# Patient Record
Sex: Male | Born: 1990 | Race: Black or African American | Hispanic: No | Marital: Single | Smoking: Former smoker
Health system: Southern US, Community
[De-identification: ages and names within clinical notes are randomized; demographics above are authoritative.]

## PROBLEM LIST (undated history)

## (undated) DIAGNOSIS — J40 Bronchitis, not specified as acute or chronic: Secondary | ICD-10-CM

## (undated) HISTORY — PX: HIP SURGERY: SHX245

---

## 2014-10-23 DIAGNOSIS — J40 Bronchitis, not specified as acute or chronic: Secondary | ICD-10-CM

## 2014-10-23 HISTORY — DX: Bronchitis, not specified as acute or chronic: J40

## 2015-08-08 ENCOUNTER — Emergency Department (HOSPITAL_COMMUNITY): Payer: Self-pay

## 2015-08-08 ENCOUNTER — Emergency Department (HOSPITAL_COMMUNITY)
Admission: EM | Admit: 2015-08-08 | Discharge: 2015-08-08 | Disposition: A | Discharge status: Home / Self Care | Payer: Self-pay | Attending: Emergency Medicine | Admitting: Emergency Medicine

## 2015-08-08 ENCOUNTER — Encounter (HOSPITAL_COMMUNITY): Payer: Self-pay | Admitting: Emergency Medicine

## 2015-08-08 DIAGNOSIS — B353 Tinea pedis: Secondary | ICD-10-CM | POA: Insufficient documentation

## 2015-08-08 DIAGNOSIS — R0602 Shortness of breath: Secondary | ICD-10-CM | POA: Insufficient documentation

## 2015-08-08 DIAGNOSIS — R0789 Other chest pain: Secondary | ICD-10-CM | POA: Insufficient documentation

## 2015-08-08 LAB — BASIC METABOLIC PANEL
Anion gap: 13 (ref 5–15)
BUN: 10 mg/dL (ref 6–20)
CALCIUM: 10 mg/dL (ref 8.9–10.3)
CO2: 25 mmol/L (ref 22–32)
CREATININE: 1.15 mg/dL (ref 0.61–1.24)
Chloride: 101 mmol/L (ref 101–111)
Glucose, Bld: 114 mg/dL — ABNORMAL HIGH (ref 65–99)
Potassium: 3.2 mmol/L — ABNORMAL LOW (ref 3.5–5.1)
SODIUM: 139 mmol/L (ref 135–145)

## 2015-08-08 LAB — CBC
HCT: 42.7 % (ref 39.0–52.0)
Hemoglobin: 14.5 g/dL (ref 13.0–17.0)
MCH: 27.9 pg (ref 26.0–34.0)
MCHC: 34 g/dL (ref 30.0–36.0)
MCV: 82.3 fL (ref 78.0–100.0)
PLATELETS: 339 10*3/uL (ref 150–400)
RBC: 5.19 MIL/uL (ref 4.22–5.81)
RDW: 13.6 % (ref 11.5–15.5)
WBC: 7.9 10*3/uL (ref 4.0–10.5)

## 2015-08-08 LAB — I-STAT TROPONIN, ED: TROPONIN I, POC: 0 ng/mL (ref 0.00–0.08)

## 2015-08-08 MED ORDER — TOLNAFTATE 1 % EX POWD: 1.0000 "application " | Freq: Two times a day (BID) | CUTANEOUS | Status: DC

## 2015-08-08 NOTE — Discharge Instructions (Signed)

## 2015-08-08 NOTE — ED Provider Notes (Signed)
CSN: [id number]     Arrival date & time 08/08/15  1855 History   First MD Initiated Contact with Patient 08/08/15 2006     Chief Complaint  Patient presents with  . Chest Pain  . Foot Problem     (Consider location/radiation/quality/duration/timing/severity/associated sxs/prior Treatment) HPI Comments: Patient here after he developed some chest discomfort that he smoked marijuana. Became more short of breath and that is now resolved. Also complains of some blisters to his bilateral feet. States that his feet are persistently moist. No fever or chills. No purulent drainage. Denies any auditory or visual hallucinations. No other ingestions or drugs use or alcohol this time  Patient is a 24 y.o. male presenting with chest pain. The history is provided by the patient.  Chest Pain   History reviewed. No pertinent past medical history. Past Surgical History  Procedure Laterality Date  . Hip surgery     History reviewed. No pertinent family history. Social History  Substance Use Topics  . Smoking status: None  . Smokeless tobacco: None  . Alcohol Use: None    Review of Systems  Cardiovascular: Positive for chest pain.  All other systems reviewed and are negative.     Allergies  Review of patient's allergies indicates no known allergies.  Home Medications   Prior to Admission medications   Not on File   BP 150/86 mmHg  Pulse 98  Temp(Src) 98.5 F (36.9 C) (Oral)  Resp 20  SpO2 100% Physical Exam  Constitutional: He is oriented to person, place, and time. He appears well-developed and well-nourished.  Non-toxic appearance. No distress.  HENT:  Head: Normocephalic and atraumatic.  Eyes: Conjunctivae, EOM and lids are normal. Pupils are equal, round, and reactive to light.  Neck: Normal range of motion. Neck supple. No tracheal deviation present. No thyroid mass present.  Cardiovascular: Normal rate, regular rhythm and normal heart sounds.  Exam reveals no gallop.   No  murmur heard. Pulmonary/Chest: Effort normal and breath sounds normal. No stridor. No respiratory distress. He has no decreased breath sounds. He has no wheezes. He has no rhonchi. He has no rales.  Abdominal: Soft. Normal appearance and bowel sounds are normal. He exhibits no distension. There is no tenderness. There is no rebound and no CVA tenderness.  Musculoskeletal: Normal range of motion. He exhibits no edema or tenderness.  Changes on feet consistent with athlete's foot  Neurological: He is alert and oriented to person, place, and time. He has normal strength. No cranial nerve deficit or sensory deficit. GCS eye subscore is 4. GCS verbal subscore is 5. GCS motor subscore is 6.  Skin: Skin is warm and dry. No abrasion and no rash noted.  Psychiatric: He has a normal mood and affect. His speech is normal and behavior is normal.  Nursing note and vitals reviewed.   ED Course  Procedures (including critical care time) Labs Review Labs Reviewed  CBC  BASIC METABOLIC PANEL  I-STAT TROPOININ, ED    Imaging Review Dg Chest 2 View  08/08/2015  CLINICAL DATA:  Right side chest pain and shortness of breath today. Initial encounter. EXAM: CHEST  2 VIEW COMPARISON:  None. FINDINGS: The lungs are clear. Heart size is normal. There is no pneumothorax or pleural effusion. No focal bony abnormality. IMPRESSION: No acute disease. Electronically Signed   By: Drusilla Kannerhomas  Dalessio M.D.   On: 08/08/2015 19:45   I have personally reviewed and evaluated these images and lab results as part of my medical  decision-making.   EKG Interpretation   Date/Time:  Sunday August 08 2015 19:17:57 EDT Ventricular Rate:  96 PR Interval:  172 QRS Duration: 100 QT Interval:  324 QTC Calculation: 409 R Axis:   40 Text Interpretation:  Sinus rhythm Borderline ST elevation, anterior leads  Confirmed by Freida Busman  MD, Maysoon Lozada ([id number]) on 08/08/2015 8:06:55 PM      MDM   Final diagnoses:  None    Patient  instructed on proper care of his feet. This is read as negative. Stable for discharge    Lorre Nick, MD 08/08/15 2049

## 2015-08-08 NOTE — ED Notes (Signed)
Pt states he "smoked some pot earlier with a friend, and afterwards my chest just started hurting a lot." States the chest pain is making his feel slightly SOB. Also complaining of blister to lower right foot, and says he's expectorated some pus from the wound. No obvious redness/swelling to top of foot. Appears to be a blister to bottom of right foot.

## 2015-10-01 ENCOUNTER — Emergency Department (HOSPITAL_COMMUNITY): Payer: Self-pay

## 2015-10-01 ENCOUNTER — Emergency Department (HOSPITAL_COMMUNITY)
Admission: EM | Admit: 2015-10-01 | Discharge: 2015-10-01 | Disposition: A | Discharge status: Home / Self Care | Payer: Self-pay | Attending: Emergency Medicine | Admitting: Emergency Medicine

## 2015-10-01 DIAGNOSIS — R0789 Other chest pain: Secondary | ICD-10-CM | POA: Insufficient documentation

## 2015-10-01 DIAGNOSIS — Z79899 Other long term (current) drug therapy: Secondary | ICD-10-CM | POA: Insufficient documentation

## 2015-10-01 DIAGNOSIS — J40 Bronchitis, not specified as acute or chronic: Secondary | ICD-10-CM | POA: Insufficient documentation

## 2015-10-01 LAB — I-STAT TROPONIN, ED: Troponin i, poc: 0 ng/mL (ref 0.00–0.08)

## 2015-10-01 LAB — CBC
HEMATOCRIT: 44.9 % (ref 39.0–52.0)
HEMOGLOBIN: 15.1 g/dL (ref 13.0–17.0)
MCH: 27.7 pg (ref 26.0–34.0)
MCHC: 33.6 g/dL (ref 30.0–36.0)
MCV: 82.2 fL (ref 78.0–100.0)
Platelets: 267 10*3/uL (ref 150–400)
RBC: 5.46 MIL/uL (ref 4.22–5.81)
RDW: 13.5 % (ref 11.5–15.5)
WBC: 6 10*3/uL (ref 4.0–10.5)

## 2015-10-01 LAB — BASIC METABOLIC PANEL
Anion gap: 13 (ref 5–15)
BUN: 11 mg/dL (ref 6–20)
CHLORIDE: 102 mmol/L (ref 101–111)
CO2: 22 mmol/L (ref 22–32)
Calcium: 9.9 mg/dL (ref 8.9–10.3)
Creatinine, Ser: 1.08 mg/dL (ref 0.61–1.24)
GFR calc Af Amer: >60 mL/min (ref >60)
GFR calc non Af Amer: >60 mL/min (ref >60)
Glucose, Bld: 95 mg/dL (ref 65–99)
POTASSIUM: 3.6 mmol/L (ref 3.5–5.1)
SODIUM: 137 mmol/L (ref 135–145)

## 2015-10-01 MED ORDER — NAPROXEN 500 MG PO TABS: 500.0000 mg | ORAL_TABLET | Freq: Two times a day (BID) | ORAL | Status: DC

## 2015-10-01 MED ORDER — FLUTICASONE PROPIONATE 50 MCG/ACT NA SUSP: 1.0000 | Freq: Two times a day (BID) | NASAL | Status: DC

## 2015-10-01 NOTE — Discharge Instructions (Signed)
1. Medications: Flonase, naproxen - this medicine is best taken with food, continue usual home medications 2. Treatment: rest, drink plenty of fluids 3. Follow Up: Please follow up with your primary doctor in 3 days for discussion of your diagnoses and further evaluation after today's visit; if you do not have a primary care doctor use the resource guide provided to find one; Please return to the ER for fever, worsening chest pain, any new or worsening symptoms, any additional concerns.    Emergency Department Resource Guide 1) Find a Doctor and Pay Out of Pocket Although you won't have to find out who is covered by your insurance plan, it is a good idea to ask around and get recommendations. You will then need to call the office and see if the doctor you have chosen will accept you as a new patient and what types of options they offer for patients who are self-pay. Some doctors offer discounts or will set up payment plans for their patients who do not have insurance, but you will need to ask so you aren't surprised when you get to your appointment.  2) Contact Your Local Health Department Not all health departments have doctors that can see patients for sick visits, but many do, so it is worth a call to see if yours does. If you don't know where your local health department is, you can check in your phone book. The CDC also has a tool to help you locate your state's health department, and many state websites also have listings of all of their local health departments.  3) Find a Walk-in Clinic If your illness is not likely to be very severe or complicated, you may want to try a walk in clinic. These are popping up all over the country in pharmacies, drugstores, and shopping centers. They're usually staffed by nurse practitioners or physician assistants that have been trained to treat common illnesses and complaints. They're usually fairly quick and inexpensive. However, if you have serious medical  issues or chronic medical problems, these are probably not your best option.  No Primary Care Doctor: - Call Health Connect at  [phone] - they can help you locate a primary care doctor that  accepts your insurance, provides certain services, etc. - Physician Referral Service- [phone]  Chronic Pain Problems: Organization         Address  Phone   Notes  [facility]  [phone] Patients need to be referred by their primary care doctor.   Medication Assistance: Organization         Address  Phone   Notes  [facility] [address] [place], [place] [address] [phone]) [phone] --Must be a resident of [place] -- Must have NO insurance coverage whatsoever (no Medicaid/ Medicare, etc.) -- The pt. MUST have a primary care doctor that directs their care regularly and follows them in the community   [facility]ist  [phone]   [employer]  [phone]    Agencies that provide inexpensive medical care: Organization         Address  Phone   Notes  [facility]  [phone]   [facility]    [phone]   [facility] [address] [place], [place] [address] [phone]   [facility] [address], [place] [phone]) [phone]   [facility]    [phone](336) [phone]   [facility]    ([phone]) [phone]   °[facility] ° [address], [place] Phone:  ([phone]) [phone], Fax:  [phone] Hours of Operation:  9 am - 6 pm, M-F.  Also accepts Medicaid/Medicare and self-pay.  °[facility] ° [address], [place] Phone: [phone]) [phone], Fax: [phone]. Hours of Operation:  8:30 am - 5:30 pm, M-F.  Also accepts Medicaid and self-pay.  °[facility] [place] [address], [place] Phone: ([phone]) [phone]   °[facility] [address], [place], [place]  (336)[phone], Ext. 123 Mondays & Thursdays: 7-9 AM.  First 15 patients are seen on a first come, first serve basis. °  ° °Medicaid-accepting [place] Providers: ° °Organization         Address  Phone   Notes  °[facility] [address], [place] [phone]) [phone] Also accepts self-pay patients.  °[facility] [address], [place] ° ([phone]) [phone]   °[facility] [address], [place] ([phone]) [phone]   °[facility] [address] [place] [address], [place] ([phone]) [phone]   °Veita Bland [address], [place]  ° ([phone]) [phone] Only accepts [place] Access Medicaid patients after they have their name applied to their card.  ° °Self-Pay (no insurance) in [place]: ° °Organization         Address  Phone   Notes  °Sickle Cell Patients, [facility] [address], [place] ([phone]) [phone]   °[facility] [address], [place] (336) [phone]   °[facility] ° [address], [place] ([phone]) [phone]   °[facility]/Dr. Osei-Bonsu ° [address] [place] [address], [place] or [address], [place] [phone]) [phone] Phone number for both [place] and [place] locations is the same.  °[facility] [address], [place] ([phone]) [phone]   °[facility] [address] [place] [address], [place] or [address] (336) [phone] °(336) [phone]   °[facility] [address], [place] (336) [phone], phone; ([phone]) [phone], fax Sees patients 1st and 3rd Saturday of every month.  Must not qualify for public or private insurance (i.e. Medicaid, Medicare, [place] Health Choice, Veterans' Benefits) • Household income should be no more than 200% of the poverty level •The clinic cannot treat you if you are pregnant or think you are pregnant • Sexually transmitted  diseases are not treated at the clinic.  ° ° °Dental Care: °Organization         Address  Phone  Notes  °[place] [facility] [address], [place] (336) [phone] Accepts children up to age 21 who are enrolled in Medicaid or [place] Health Choice; pregnant women with a Medicaid card; and children who have applied for Medicaid or [place] Health Choice, but were declined, whose parents can pay a reduced fee at time of service.  °[place] [facility] [place]  [address], [place] (336) 641-7733 Accepts children up to age 21 who are enrolled in Medicaid or [place] Health Choice; pregnant women with a Medicaid card; and children who have applied for Medicaid or [place] Health Choice, but were declined, whose parents can pay a reduced fee at time of   service.  °[facility] ° [address], [place] ([phone]) [phone] Patients are seen by appointment only. Walk-ins are not accepted. [facility] will see patients 18 years of age and older. °Monday - Tuesday (8am-5pm) °Most Wednesdays (8:30-5pm) °$30 per visit, cash only  °[facility] ° [address], [place] ([phone]) [phone] Patients are seen by appointment only. Walk-ins are not accepted. [facility] will see patients 18 years of age and older. °One Wednesday Evening (Monthly: Volunteer Based).  $30 per visit, cash only  °[facility]  ([phone]) [phone] for adults; Children under age 4, call Graduate Pediatric Dentistry at ([phone]. Children aged 4-14, please call [phone]) [phone] to request a pediatric application. ° Dental services are provided in all areas of dental care including fillings, crowns and bridges, complete and partial dentures, implants, gum treatment, root canals, and extractions. Preventive care is also provided. Treatment is provided to both adults and children. °Patients are selected via a  lottery and there is often a waiting list. °  °[facility] [address], °[place] ° (336) 763-8833 [email or web address].[email or web address] °  °[facility] [address], [place], [place] (336)[phone], Ext. 123 Second and Fourth Thursday of each month, opens at 6:30 AM; Clinic ends at 9 AM.  Patients are seen on a first-come first-served basis, and a limited number are seen during each clinic.  ° °[facility] ° [address], [place], [place] ([phone]) [phone]   Eligibility Requirements °You must have lived in [place], [place], or [place] counties for at least the last three months. °  You cannot be eligible for state or federal sponsored healthcare insurance, including Veterans Administration, Medicaid, or Medicare. °  You generally cannot be eligible for healthcare insurance through your employer.  °  How to apply: °Eligibility screenings are held every Tuesday and Wednesday afternoon from 1:00 pm until 4:00 pm. You do not need an appointment for the interview!  °[facility] [address], [place], [place] [phone]   °[facility]  [phone]   °[facility]  336-703-3100   °[facility]  336-570-6415   ° °Behavioral Health Resources in the Community: °Intensive Outpatient Programs °Organization         Address  Phone  Notes  °[facility] [address], [place], [place] [phone]   °[facility] [address], [place], [place] 336-832-9800   °[facility] [address], [place], [place] ° 336-882-2125   °[facility] [address],  °[place], [place] 1-800-853-5163 or 336-641-4981   °Substance Abuse Resources °Organization         Address  Phone  Notes  °[facility]  336-882-2125   °[facility]  336-784-9470   °[facility]  336-285-9073   °[facility]  336-845-3988   °Residential &  Outpatient Substance Abuse Program  1-800-659-3381   °Psychological Services °Organization         Address  Phone  Notes  °[facility]  336- 832-9600   °[facility]  336- 378-7881   °[facility] [address], [place] 1-800-853-5163 or 336-641-4981   ° °Mobile Crisis Teams °Organization         Address  Phone  Notes  °[facility], Mobile Crisis Care Unit  1-877-626-1772   °Assertive °Psychotherapeutic Services °   [place], [place]   [facility] [address] [place] [phone])[phone]    Self-Help/Support Groups Organization         Address  Phone             Notes  [place] [facility] [place] - variety of support groups  [place] Call for more information  Narcotics Anonymous (NA), [facility] [address], [employer] [place]  2 meetings at this location   Special educational needs teacher         Address  Phone  Notes  [facility] [place],    [place]  1-734-814-4231   [facility]  [address], [place] [address], [place], [place]   [place], [place] [phone] Admissions: 8am-3pm M-F  Incentives Substance [place] [address],    [place], [place] [phone]   [facility] [address] [place], [place], [place]   [facility] [address],  [place], [place]   [facility] - Intensive Outpatient [address], [place], [place]   [facility] ([place]  [place], [place] 1-(347)298-3894 or 563-851-8313   [facility] [address] [place], [place] Accepts Medicaid  [facility] [address]  [place] 1-(716)779-6540 Substance Abuse/Addiction Treatment   [facility] Organization          Address  Phone  Notes  [facility]  412-686-1849   Domenic Schwab, PhD [address] [place], [place]   (867) 513-2775 or [phone]   [facility] [place], [place] (224)[phone]   [facility] [address] [place], [place] [phone] Insurance/Medicaid/sponsorship through [facility] and Families [address]                                    [place], [place] [phone] [place] [address], [place] [phone]    Dr. Adele Schilder  [phone]   [facility] Dept. 1) [address], [place] 2) [place] 3)  [place] [address], [place] [phone]  [phone]   [place] [phone] or [phone] (After Hours)

## 2015-10-01 NOTE — ED Provider Notes (Addendum)
CSN: [id number]     Arrival date & time 10/01/15  1317 History   First MD Initiated Contact with Patient 10/01/15 1637     Chief Complaint  Patient presents with  . Chest Pain     (Consider location/radiation/quality/duration/timing/severity/associated sxs/prior Treatment) HPI  Kevin Boone is a 24 y.o. male  with no significant PMH who presents to the Emergency Department complaining of sharp chest pain episodes lasting 1-3 minutes for the past 3-4 days. Worse with movement and deep breathing. Patient states last night he was coughing up phlegm, then noticed blood in the mucus - less than a tsp. Had one more incident of hemoptysis this morning, once again less than a tsp. Admits to subjective fever at home, PND, and left shoulder pain.    No past medical history on file. Past Surgical History  Procedure Laterality Date  . Hip surgery     No family history on file. Social History  Substance Use Topics  . Smoking status: Not on file  . Smokeless tobacco: Not on file  . Alcohol Use: Not on file    Review of Systems  Constitutional: Positive for fever and chills. Negative for diaphoresis, activity change, appetite change, fatigue and unexpected weight change.  HENT: Positive for congestion and postnasal drip. Negative for sore throat.   Eyes: Negative for visual disturbance.  Respiratory: Positive for cough and chest tightness. Negative for shortness of breath and wheezing.   Cardiovascular: Positive for chest pain. Negative for palpitations and leg swelling.  Gastrointestinal: Negative for nausea, vomiting, abdominal pain, diarrhea and constipation.  Endocrine: Negative for polydipsia and polyuria.  Musculoskeletal: Negative for myalgias, back pain, arthralgias and neck pain.  Skin: Negative for rash.  Neurological: Negative for dizziness, weakness and headaches.      Allergies  Review of patient's allergies indicates no known allergies.  Home Medications   Prior to  Admission medications   Medication Sig Start Date End Date Taking? Authorizing Provider  fluticasone (FLONASE) 50 MCG/ACT nasal spray Place 1 spray into both nostrils 2 (two) times daily. 10/01/15   Chase PicketJaime Pilcher Rhyse Loux, PA-C  naproxen (NAPROSYN) 500 MG tablet Take 1 tablet (500 mg total) by mouth 2 (two) times daily. 10/01/15   Eartha Vonbehren Pilcher Shenice Dolder, PA-C  tolnaftate (TINACTIN) 1 % powder Apply 1 application topically 2 (two) times daily. 08/08/15   Lorre NickAnthony Allen, MD   BP 131/86 mmHg  Pulse 62  Temp(Src) 98.5 F (36.9 C) (Oral)  Resp 20  Ht 6\' 2"  (1.88 m)  Wt 97.523 kg  BMI 27.59 kg/m2  SpO2 100% Physical Exam  Constitutional: He is oriented to person, place, and time. He appears well-developed and well-nourished.  Alert and in no acute distress  HENT:  Head: Normocephalic and atraumatic.  Cardiovascular: Normal rate, regular rhythm and normal heart sounds.  Exam reveals no gallop and no friction rub.   No murmur heard. Pulmonary/Chest: Effort normal and breath sounds normal. No respiratory distress. He has no wheezes. He has no rales.    Abdominal: Soft. Bowel sounds are normal. He exhibits no distension and no mass. There is no tenderness. There is no rebound and no guarding.  Musculoskeletal: He exhibits no edema.  Neurological: He is alert and oriented to person, place, and time.  Skin: Skin is warm and dry. No rash noted.  Nursing note and vitals reviewed.   ED Course  Procedures (including critical care time) Labs Review Labs Reviewed  BASIC METABOLIC PANEL  CBC  I-STAT TROPOININ, ED  Imaging Review Dg Chest 2 View  10/01/2015  CLINICAL DATA:  Chest pain for 2 week EXAM: CHEST  2 VIEW COMPARISON:  08/08/2015 FINDINGS: Normal mediastinum and cardiac silhouette. Normal pulmonary vasculature. No evidence of effusion, infiltrate, or pneumothorax. No acute bony abnormality. IMPRESSION: No acute cardiopulmonary process.  Normal chest radiograph. Electronically Signed   By:  Genevive Bi M.D.   On: 10/01/2015 14:04   I have personally reviewed and evaluated these images and lab results as part of my medical decision-making.   EKG Interpretation   Date/Time:  Friday October 01 2015 13:20:02 EST Ventricular Rate:  99 PR Interval:  164 QRS Duration: 102 QT Interval:  340 QTC Calculation: 436 R Axis:   49 Text Interpretation:  Normal sinus rhythm Right atrial enlargement  Borderline ECG Left ventricular hypertrophy Otherwise no significant  change Confirmed by FLOYD MD, Reuel Boom ([id number]) on 10/01/2015 4:58:26 PM      MDM   Final diagnoses:  Atypical chest pain  Bronchitis   Kevin Boone presents with chest and right shoulder pain that is reproducible with palpation and movement.  PERC negative, 0 Wells Score, HEART score 0  Labs: trop 0, CBC and BMP wdl Imaging: CXR with no acute cardiopulm dz   A&P: Atypical chest pain likely 2/2 bronchitis vs. Musculoskeletal   - Flonase, naproxen, symptomatic care instructions given  - PCP follow up encouraged, Return precautions given  Blood pressure 131/86, pulse 62, temperature 98.5 F (36.9 C), temperature source Oral, resp. rate 20, height  (1.88 m), weight 97.523 kg, SpO2 100 %.  Chase Picket [place] R[place]on, PA-C 10/01/15 1835  Melene Plan, DO 10/01/15 1843  Chase Picket Mendell Bontempo, PA-C 11/10/15 1544  Melene Plan, DO 11/10/15 1845

## 2015-10-01 NOTE — ED Notes (Signed)
Pt states last night he was coughing up blood, pt c/o pressure in his chest. Started 2-3 days ago.

## 2015-10-04 ENCOUNTER — Encounter (HOSPITAL_COMMUNITY): Payer: Self-pay

## 2015-10-04 ENCOUNTER — Emergency Department (HOSPITAL_COMMUNITY)
Admission: EM | Admit: 2015-10-04 | Discharge: 2015-10-04 | Disposition: A | Discharge status: Home / Self Care | Payer: Self-pay | Attending: Emergency Medicine | Admitting: Emergency Medicine

## 2015-10-04 DIAGNOSIS — R0789 Other chest pain: Secondary | ICD-10-CM | POA: Insufficient documentation

## 2015-10-04 DIAGNOSIS — Z791 Long term (current) use of non-steroidal anti-inflammatories (NSAID): Secondary | ICD-10-CM | POA: Insufficient documentation

## 2015-10-04 DIAGNOSIS — F17200 Nicotine dependence, unspecified, uncomplicated: Secondary | ICD-10-CM | POA: Insufficient documentation

## 2015-10-04 DIAGNOSIS — F419 Anxiety disorder, unspecified: Secondary | ICD-10-CM | POA: Insufficient documentation

## 2015-10-04 DIAGNOSIS — J069 Acute upper respiratory infection, unspecified: Secondary | ICD-10-CM | POA: Insufficient documentation

## 2015-10-04 NOTE — ED Provider Notes (Signed)
CSN: [id number]     Arrival date & time 10/04/15  [id number] History   First MD Initiated Contact with Patient 10/04/15 [id number]     Chief Complaint  Patient presents with  . Hemoptysis     (Consider location/radiation/quality/duration/timing/severity/associated sxs/prior Treatment) HPI Comments: Patient with history only of being a smoker presents for re-evaluation of cough, productive of blood-tinged sputum, and chest wall pain. No fever. He was initially seen 10/01/15 in the emergency department and was told he had bronchitis. He was given a steroid nasal spray and Naproxen and reports he was told to return if the chest pain did not get better. He was not able to fill the prescription for the steroid nasal spray.   The history is provided by the patient. No language interpreter was used.    History reviewed. No pertinent past medical history. Past Surgical History  Procedure Laterality Date  . Hip surgery     No family history on file. Social History  Substance Use Topics  . Smoking status: Current Every Day Smoker  . Smokeless tobacco: None  . Alcohol Use: No    Review of Systems  Constitutional: Positive for chills. Negative for fever.  HENT: Positive for congestion.   Respiratory: Negative.   Cardiovascular: Negative.   Gastrointestinal: Negative.  Negative for nausea, vomiting and abdominal pain.  Musculoskeletal: Negative.  Negative for myalgias and neck stiffness.  Skin: Negative.  Negative for rash.  Neurological: Negative.       Allergies  Review of patient's allergies indicates no known allergies.  Home Medications   Prior to Admission medications   Medication Sig Start Date End Date Taking? Authorizing Provider  naproxen (NAPROSYN) 500 MG tablet Take 1 tablet (500 mg total) by mouth 2 (two) times daily. 10/01/15  Yes Jaime Pilcher Ward, PA-C  fluticasone (FLONASE) 50 MCG/ACT nasal spray Place 1 spray into both nostrils 2 (two) times daily. Patient not taking:  Reported on 10/04/2015 10/01/15   [facility]   BP 121/73 mmHg  Pulse 70  Temp(Src) 97.9 F (36.6 C) (Oral)  Resp 20  SpO2 100% Physical Exam  Constitutional: He is oriented to person, place, and time. He appears well-developed and well-nourished. No distress.  Eyes: Conjunctivae are normal.  Neck: Normal range of motion. Neck supple.  Cardiovascular: Normal rate and regular rhythm.   No murmur heard. Pulmonary/Chest: Effort normal. He has no wheezes. He has no rales. He exhibits tenderness.  Mild left chest wall tenderness.   Abdominal: Soft. There is no tenderness.  Musculoskeletal: Normal range of motion.  Neurological: He is alert and oriented to person, place, and time.  Skin: Skin is warm and dry.  Psychiatric: His mood appears anxious.    ED Course  Procedures (including critical care time) Labs Review Labs Reviewed - No data to display  Imaging Review No results found. I have personally reviewed and evaluated these images and lab results as part of my medical decision-making.   EKG Interpretation None      MDM   Final diagnoses:  None    1. URI  The patient is non-toxic in appearance, awake, alert, no respiratory difficulty and no active coughing during interview or exam. He has chest wall tenderness without concern for rib injury. VSS, no hypoxia, tachycardia or tachypnea. Review of chest x-ray from 10/01/15 is negative. Suspect viral process. Patient appears to have component of anxiety adding to his worry regarding symptoms. He is stable and appropriate for discharge with repeat imaging.  Elpidio Anis, PA-C 10/04/15 1610  Dione Booze, MD 10/04/15 [phone]

## 2015-10-04 NOTE — ED Notes (Signed)
Was seen here on 12/9 for chest pain and given antibiotics but told they didn't know what was wrong with him. Tonight coughed up blood for the first time and is concerned.

## 2015-10-04 NOTE — ED Notes (Signed)
Pt verbalized understanding of d/c instructions and follow-up care. No further questions/concerns, VSS, ambulatory w/ steady gait (refused wheelchair) 

## 2015-10-04 NOTE — Discharge Instructions (Signed)
Chest Wall Pain °Chest wall pain is pain in or around the bones and muscles of your chest. Sometimes, an injury causes this pain. Sometimes, the cause may not be known. This pain may take several weeks or longer to get better. °HOME CARE INSTRUCTIONS  °Pay attention to any changes in your symptoms. Take these actions to help with your pain:  °· Rest as told by your health care provider.   °· Avoid activities that cause pain. These include any activities that use your chest muscles or your abdominal and side muscles to lift heavy items.    °· If directed, apply ice to the painful area: °· Put ice in a plastic bag. °· Place a towel between your skin and the bag. °· Leave the ice on for 20 minutes, 2-3 times per day. °· Take over-the-counter and prescription medicines only as told by your health care provider. °· Do not use tobacco products, including cigarettes, chewing tobacco, and e-cigarettes. If you need help quitting, ask your health care provider. °· Keep all follow-up visits as told by your health care provider. This is important. °SEEK MEDICAL CARE IF: °· You have a fever. °· Your chest pain becomes worse. °· You have new symptoms. °SEEK IMMEDIATE MEDICAL CARE IF: °· You have nausea or vomiting. °· You feel sweaty or light-headed. °· You have a cough with phlegm (sputum) or you cough up blood. °· You develop shortness of breath. °  °This information is not intended to replace advice given to you by your health care provider. Make sure you discuss any questions you have with your health care provider. °  °Document Released: 10/09/2005 Document Revised: 06/30/2015 Document Reviewed: 01/04/2015 °Elsevier Interactive Patient Education ©2016 Else[employer] Inc. ° °Upper Respiratory Infection, Adult °Most upper respiratory infections (URIs) are a viral infection of the air passages leading to the lungs. A URI affects the nose, throat, and upper air passages. The most common type of URI is nasopharyngitis and is typically  referred to as "the common cold." °URIs run their course and usually go away on their own. Most of the time, a URI does not require medical attention, but sometimes a bacterial infection in the upper airways can follow a viral infection. This is called a secondary infection. Sinus and middle ear infections are common types of secondary upper respiratory infections. °Bacterial pneumonia can also complicate a URI. A URI can worsen asthma and chronic obstructive pulmonary disease (COPD). Sometimes, these complications can require emergency medical care and may be life threatening.  °CAUSES °Almost all URIs are caused by viruses. A virus is a type of germ and can spread from one person to another.  °RISKS FACTORS °You may be at risk for a URI if:  °· You smoke.   °· You have chronic heart or lung disease. °· You have a weakened defense (immune) system.   °· You are very young or very old.   °· You have nasal allergies or asthma. °· You work in crowded or poorly ventilated areas. °· You work in health care facilities or schools. °SIGNS AND SYMPTOMS  °Symptoms typically develop 2-3 days after you come in contact with a cold virus. Most viral URIs last 7-10 days. However, viral URIs from the influenza virus (flu virus) can last 14-18 days and are typically more severe. Symptoms may include:  °· Runny or stuffy (congested) nose.   °· Sneezing.   °· Cough.   °· Sore throat.   °· Headache.   °· Fatigue.   °· Fever.   °· Loss of appetite.   °· Pain   in your forehead, behind your eyes, and over your cheekbones (sinus pain). °· Muscle aches.   °DIAGNOSIS  °Your health care provider may diagnose a URI by: °· Physical exam. °· Tests to check that your symptoms are not due to another condition such as: °¨ Strep throat. °¨ Sinusitis. °¨ Pneumonia. °¨ Asthma. °TREATMENT  °A URI goes away on its own with time. It cannot be cured with medicines, but medicines may be prescribed or recommended to relieve symptoms. Medicines may  help: °· Reduce your fever. °· Reduce your cough. °· Relieve nasal congestion. °HOME CARE INSTRUCTIONS  °· Take medicines only as directed by your health care provider.   °· Gargle warm saltwater or take cough drops to comfort your throat as directed by your health care provider. °· Use a warm mist humidifier or inhale steam from a shower to increase air moisture. This may make it easier to breathe. °· Drink enough fluid to keep your urine clear or pale yellow.   °· Eat soups and other clear broths and maintain good nutrition.   °· Rest as needed.   °· Return to work when your temperature has returned to normal or as your health care provider advises. You may need to stay home longer to avoid infecting others. You can also use a face mask and careful hand washing to prevent spread of the virus. °· Increase the usage of your inhaler if you have asthma.   °· Do not use any tobacco products, including cigarettes, chewing tobacco, or electronic cigarettes. If you need help quitting, ask your health care provider. °PREVENTION  °The best way to protect yourself from getting a cold is to practice good hygiene.  °· Avoid oral or hand contact with people with cold symptoms.   °· Wash your hands often if contact occurs.   °There is no clear evidence that vitamin C, vitamin E, echinacea, or exercise reduces the chance of developing a cold. However, it is always recommended to get plenty of rest, exercise, and practice good nutrition.  °SEEK MEDICAL CARE IF:  °· You are getting worse rather than better.   °· Your symptoms are not controlled by medicine.   °· You have chills. °· You have worsening shortness of breath. °· You have brown or red mucus. °· You have yellow or brown nasal discharge. °· You have pain in your face, especially when you bend forward. °· You have a fever. °· You have swollen neck glands. °· You have pain while swallowing. °· You have white areas in the back of your throat. °SEEK IMMEDIATE MEDICAL CARE IF:   °· You have severe or persistent: °¨ Headache. °¨ Ear pain. °¨ Sinus pain. °¨ Chest pain. °· You have chronic lung disease and any of the following: °¨ Wheezing. °¨ Prolonged cough. °¨ Coughing up blood. °¨ A change in your usual mucus. °· You have a stiff neck. °· You have changes in your: °¨ Vision. °¨ Hearing. °¨ Thinking. °¨ Mood. °MAKE SURE YOU:  °· Understand these instructions. °· Will watch your condition. °· Will get help right away if you are not doing well or get worse. °  °This information is not intended to replace advice given to you by your health care provider. Make sure you discuss any questions you have with your health care provider. °  °Document Released: 04/04/2001 Document Revised: 02/23/2015 Document Reviewed: 01/14/2014 °Elsevier Interactive Patient Education ©2016 Elsevier Inc. ° °

## 2015-10-04 NOTE — ED Notes (Signed)
Pt ambulatory w/ steady gait, SpO2 100% and hr 80bpm.

## 2015-10-20 ENCOUNTER — Encounter (HOSPITAL_COMMUNITY): Payer: Self-pay | Admitting: Emergency Medicine

## 2015-10-20 ENCOUNTER — Emergency Department (HOSPITAL_COMMUNITY): Payer: Self-pay

## 2015-10-20 DIAGNOSIS — R042 Hemoptysis: Secondary | ICD-10-CM | POA: Insufficient documentation

## 2015-10-20 DIAGNOSIS — Z791 Long term (current) use of non-steroidal anti-inflammatories (NSAID): Secondary | ICD-10-CM | POA: Insufficient documentation

## 2015-10-20 DIAGNOSIS — R109 Unspecified abdominal pain: Secondary | ICD-10-CM | POA: Insufficient documentation

## 2015-10-20 DIAGNOSIS — R079 Chest pain, unspecified: Secondary | ICD-10-CM | POA: Insufficient documentation

## 2015-10-20 DIAGNOSIS — F17200 Nicotine dependence, unspecified, uncomplicated: Secondary | ICD-10-CM | POA: Insufficient documentation

## 2015-10-20 DIAGNOSIS — Z7951 Long term (current) use of inhaled steroids: Secondary | ICD-10-CM | POA: Insufficient documentation

## 2015-10-20 LAB — COMPREHENSIVE METABOLIC PANEL
ALK PHOS: 49 U/L (ref 38–126)
ALT: 36 U/L (ref 17–63)
ANION GAP: 12 (ref 5–15)
AST: 32 U/L (ref 15–41)
Albumin: 4.3 g/dL (ref 3.5–5.0)
BUN: 13 mg/dL (ref 6–20)
CALCIUM: 9.7 mg/dL (ref 8.9–10.3)
CO2: 26 mmol/L (ref 22–32)
CREATININE: 1.02 mg/dL (ref 0.61–1.24)
Chloride: 102 mmol/L (ref 101–111)
Glucose, Bld: 105 mg/dL — ABNORMAL HIGH (ref 65–99)
Potassium: 3.9 mmol/L (ref 3.5–5.1)
SODIUM: 140 mmol/L (ref 135–145)
TOTAL PROTEIN: 6.7 g/dL (ref 6.5–8.1)
Total Bilirubin: 0.5 mg/dL (ref 0.3–1.2)

## 2015-10-20 LAB — CBC
HCT: 38.3 % — ABNORMAL LOW (ref 39.0–52.0)
HEMOGLOBIN: 12.7 g/dL — AB (ref 13.0–17.0)
MCH: 27.3 pg (ref 26.0–34.0)
MCHC: 33.2 g/dL (ref 30.0–36.0)
MCV: 82.4 fL (ref 78.0–100.0)
PLATELETS: 331 10*3/uL (ref 150–400)
RBC: 4.65 MIL/uL (ref 4.22–5.81)
RDW: 13.8 % (ref 11.5–15.5)
WBC: 5.8 10*3/uL (ref 4.0–10.5)

## 2015-10-20 LAB — URINALYSIS, ROUTINE W REFLEX MICROSCOPIC
Bilirubin Urine: NEGATIVE
Glucose, UA: NEGATIVE mg/dL
HGB URINE DIPSTICK: NEGATIVE
Ketones, ur: NEGATIVE mg/dL
LEUKOCYTES UA: NEGATIVE
NITRITE: NEGATIVE
PROTEIN: NEGATIVE mg/dL
SPECIFIC GRAVITY, URINE: 1.022 (ref 1.005–1.030)
pH: 6.5 (ref 5.0–8.0)

## 2015-10-20 LAB — LIPASE, BLOOD: LIPASE: 40 U/L (ref 11–51)

## 2015-10-20 NOTE — ED Notes (Signed)
Pt reports left flank pain x2 days with dysuria, pt also reports cough and congestion but was told he had bronchitis, states was prescribed naproxen but smptoms are still there. nad noted. Airway intact.

## 2015-10-21 ENCOUNTER — Encounter (HOSPITAL_COMMUNITY): Payer: Self-pay | Admitting: Radiology

## 2015-10-21 ENCOUNTER — Emergency Department (HOSPITAL_COMMUNITY)
Admission: EM | Admit: 2015-10-21 | Discharge: 2015-10-21 | Disposition: A | Discharge status: Home / Self Care | Payer: Self-pay | Attending: Emergency Medicine | Admitting: Emergency Medicine

## 2015-10-21 ENCOUNTER — Emergency Department (HOSPITAL_COMMUNITY): Payer: Self-pay

## 2015-10-21 DIAGNOSIS — R109 Unspecified abdominal pain: Secondary | ICD-10-CM

## 2015-10-21 DIAGNOSIS — R042 Hemoptysis: Secondary | ICD-10-CM

## 2015-10-21 DIAGNOSIS — R079 Chest pain, unspecified: Secondary | ICD-10-CM

## 2015-10-21 MED ORDER — AMOXICILLIN 500 MG PO CAPS: 500.0000 mg | ORAL_CAPSULE | Freq: Three times a day (TID) | ORAL | Status: DC

## 2015-10-21 MED ORDER — IOHEXOL 350 MG/ML SOLN
100.0000 mL | Freq: Once | INTRAVENOUS | Status: AC | PRN
  Administered 2015-10-21: 100 mL via INTRAVENOUS

## 2015-10-21 NOTE — Discharge Instructions (Signed)
Amoxicillin as prescribed.  Ibuprofen 600 mg every 6 hours as needed for pain.  You should obtain a primary physician with whom you can follow-up if you're not improving in the next week.   Hemoptysis Hemoptysis, which means coughing up blood, can be a sign of a minor problem or a serious medical condition. The blood that is coughed up may come from the lungs and airways. Coughed-up blood can also come from bleeding that occurs outside the lungs and airways. Blood can drain into the windpipe during a severe nosebleed or when blood is vomited from the stomach. Because hemoptysis can be a sign of something serious, a medical evaluation is required. For some people with hemoptysis, no definite cause is ever identified. CAUSES  The most common cause of hemoptysis is bronchitis. Some other common causes include:   A ruptured blood vessel caused by coughing or an infection.   A medical condition that causes damage to the large air passageways (bronchiectasis).   A blood clot in the lungs (pulmonary embolism).   Pneumonia.   Tuberculosis.   Breathing in a small foreign object.   Cancer. For some people with hemoptysis, no definite cause is ever identified.  HOME CARE INSTRUCTIONS  Only take over-the-counter or prescription medicines as directed by your caregiver. Do not use cough suppressants unless your caregiver approves.  If your caregiver prescribes antibiotic medicines, take them as directed. Finish them even if you start to feel better.  Do not smoke. Also avoid secondhand smoke.  Follow up with your caregiver as directed. SEEK IMMEDIATE MEDICAL CARE IF:   You cough up bloody mucus for longer than a week.  You have a blood-producing cough that is severe or getting worse.  You have a blood-producing cough thatcomes and goes over time.  You develop problems with your breathing.   You vomit blood.  You develop bloody or black-colored stools.  You have chest pain.    You develop night sweats.  You feel faint or pass out.   You have a fever or persistent symptoms for more than 2-3 days.  You have a fever and your symptoms suddenly get worse. MAKE SURE YOU:  Understand these instructions.  Will watch your condition.  Will get help right away if you are not doing well or get worse.   This information is not intended to replace advice given to you by your health care provider. Make sure you discuss any questions you have with your health care provider.   Document Released: 12/18/2001 Document Revised: 09/25/2012 Document Reviewed: 07/26/2012 Elsevier Interactive Patient Education 2016 Elsevier Inc.    Chest Wall Pain Chest wall pain is pain in or around the bones and muscles of your chest. Sometimes, an injury causes this pain. Sometimes, the cause may not be known. This pain may take several weeks or longer to get better. HOME CARE INSTRUCTIONS  Pay attention to any changes in your symptoms. Take these actions to help with your pain:   Rest as told by your health care provider.   Avoid activities that cause pain. These include any activities that use your chest muscles or your abdominal and side muscles to lift heavy items.   If directed, apply ice to the painful area:  Put ice in a plastic bag.  Place a towel between your skin and the bag.  Leave the ice on for 20 minutes, 2-3 times per day.  Take over-the-counter and prescription medicines only as told by your health care provider.  Do not use tobacco products, including cigarettes, chewing tobacco, and e-cigarettes. If you need help quitting, ask your health care provider.  Keep all follow-up visits as told by your health care provider. This is important. SEEK MEDICAL CARE IF:  You have a fever.  Your chest pain becomes worse.  You have new symptoms. SEEK IMMEDIATE MEDICAL CARE IF:  You have nausea or vomiting.  You feel sweaty or light-headed.  You have a  cough with phlegm (sputum) or you cough up blood.  You develop shortness of breath.   This information is not intended to replace advice given to you by your health care provider. Make sure you discuss any questions you have with your health care provider.   Document Released: 10/09/2005 Document Revised: 06/30/2015 Document Reviewed: 01/04/2015 Elsevier Interactive Patient Education [employer] Else[employer] Inc.

## 2015-10-21 NOTE — ED Notes (Signed)
Dr. delo at the bedside. 

## 2015-10-21 NOTE — ED Provider Notes (Signed)
CSN: [id number]     Arrival date & time 10/20/15  2023 History   First MD Initiated Contact with Patient 10/21/15 0102     Chief Complaint  Patient presents with  . Flank Pain  . Leg Pain     (Consider location/radiation/quality/duration/timing/severity/associated sxs/prior Treatment) HPI Comments: Patient is a 24 year old otherwise healthy male who presents with multiple complaints. This is his third visit in the last month with complaints of pain in his chest and cough. He reports to me that his cough is now productive and reports that there is blood in his sputum. He is also having pain in his left flank. He also reports that his tongue was green several days ago, then changed to yellow when it is normally white. He denies any fevers or chills. He denies any vomiting or diarrhea. He reports a history of smoking marijuana, however has stopped doing this since he has had this illness.  The history is provided by the patient.    History reviewed. No pertinent past medical history. Past Surgical History  Procedure Laterality Date  . Hip surgery     No family history on file. Social History  Substance Use Topics  . Smoking status: Current Every Day Smoker  . Smokeless tobacco: None  . Alcohol Use: No    Review of Systems  All other systems reviewed and are negative.     Allergies  Review of patient's allergies indicates no known allergies.  Home Medications   Prior to Admission medications   Medication Sig Start Date End Date Taking? Authorizing Provider  fluticasone (FLONASE) 50 MCG/ACT nasal spray Place 1 spray into both nostrils 2 (two) times daily. Patient not taking: Reported on 10/04/2015 10/01/15   [facility] Ward, PA-C  naproxen (NAPROSYN) 500 MG tablet Take 1 tablet (500 mg total) by mouth 2 (two) times daily. 10/01/15   Jaime Pilcher Ward, PA-C   BP 150/80 mmHg  Pulse 87  Temp(Src) 98 F (36.7 C) (Oral)  Resp 16  Ht  (1.88 m)  Wt 220 lb (99.791 kg)   BMI 28.23 kg/m2  SpO2 100% Physical Exam  Constitutional: He is oriented to person, place, and time. He appears well-developed and well-nourished. No distress.  HENT:  Head: Normocephalic and atraumatic.  Mouth/Throat: Oropharynx is clear and moist.  Neck: Normal range of motion. Neck supple.  Cardiovascular: Normal rate, regular rhythm and normal heart sounds.   Pulmonary/Chest: Effort normal and breath sounds normal. No respiratory distress. He has no wheezes.  Abdominal: Soft. Bowel sounds are normal. He exhibits no distension. There is tenderness. There is no rebound and no guarding.  There is tenderness to palpation in the left flank and left lateral abdomen.  Musculoskeletal: Normal range of motion. He exhibits no edema.  There is no calf swelling or tenderness. Homans sign is absent bilaterally.  Lymphadenopathy:    He has no cervical adenopathy.  Neurological: He is alert and oriented to person, place, and time.  Skin: Skin is warm and dry. He is not diaphoretic.  Nursing note and vitals reviewed.   ED Course  Procedures (including critical care time) Labs Review Labs Reviewed  COMPREHENSIVE METABOLIC PANEL - Abnormal; Notable for the following:    Glucose, Bld 105 (*)    All other components within normal limits  CBC - Abnormal; Notable for the following:    Hemoglobin 12.7 (*)    HCT 38.3 (*)    All other components within normal limits  LIPASE, BLOOD  URINALYSIS, ROUTINE W REFLEX MICROSCOPIC [facility])    Imaging Review Dg Chest 2 View  10/20/2015  CLINICAL DATA:  Right-sided chest pain for 1 month EXAM: CHEST - 2 VIEW COMPARISON:  10/01/2015 FINDINGS: The heart size and mediastinal contours are within normal limits. Both lungs are clear. The visualized skeletal structures are unremarkable. IMPRESSION: No active disease. Electronically Signed   By: Alcide CleverMark  Lukens M.D.   On: 10/20/2015 21:46   I have personally reviewed and evaluated these images and lab results as  part of my medical decision-making.   EKG Interpretation None      MDM   Final diagnoses:  Left flank pain    Patient presents here with a constellation of symptoms that are confusing and very difficult to attribute to one disease process. This is now his third trip to the ER this month and he appears quite frustrated that no one has solved his problem. He reports hemoptysis. I have an exceedingly low suspicion of pulmonary embolism, however a CT scan was obtained which was negative. Is also complaining of pain in his left flank. A CT scan was performed of his abdomen to rule out obstructive uropathy. This was performed and was negative as well. There was the mention of some abnormality on the right kidney, and the clinical correlation with urinalysis rules out a UTI.  At this point, I am not certain as to what is causing this patient's symptoms, however I highly doubt there is anything emergent. I will prescribe any antibiotics in case he is having a sinus infection that is leading to his hemoptysis. I feel as though he is appropriate for discharge. I will advise him to obtain a primary Dr. with whom he can follow-up if his symptoms persist.    Geoffery Lyonsouglas Semya Klinke, MD 10/21/15 [phone]

## 2015-10-21 NOTE — ED Notes (Signed)
Pt returned from CT and amb to the bathroom.

## 2015-10-24 ENCOUNTER — Emergency Department (HOSPITAL_COMMUNITY)
Admission: EM | Admit: 2015-10-24 | Discharge: 2015-10-24 | Disposition: A | Discharge status: Home / Self Care | Payer: Self-pay | Attending: Emergency Medicine | Admitting: Emergency Medicine

## 2015-10-24 ENCOUNTER — Emergency Department (EMERGENCY_DEPARTMENT_HOSPITAL)
Admit: 2015-10-24 | Discharge: 2015-10-24 | Disposition: A | Discharge status: Home / Self Care | Payer: Self-pay | Attending: Emergency Medicine | Admitting: Emergency Medicine

## 2015-10-24 ENCOUNTER — Encounter (HOSPITAL_COMMUNITY): Payer: Self-pay | Admitting: *Deleted

## 2015-10-24 DIAGNOSIS — Z792 Long term (current) use of antibiotics: Secondary | ICD-10-CM | POA: Insufficient documentation

## 2015-10-24 DIAGNOSIS — M79662 Pain in left lower leg: Secondary | ICD-10-CM | POA: Insufficient documentation

## 2015-10-24 DIAGNOSIS — M79609 Pain in unspecified limb: Secondary | ICD-10-CM

## 2015-10-24 DIAGNOSIS — F17200 Nicotine dependence, unspecified, uncomplicated: Secondary | ICD-10-CM | POA: Insufficient documentation

## 2015-10-24 DIAGNOSIS — R05 Cough: Secondary | ICD-10-CM | POA: Insufficient documentation

## 2015-10-24 DIAGNOSIS — R6889 Other general symptoms and signs: Secondary | ICD-10-CM

## 2015-10-24 DIAGNOSIS — R0989 Other specified symptoms and signs involving the circulatory and respiratory systems: Secondary | ICD-10-CM | POA: Insufficient documentation

## 2015-10-24 LAB — CBC WITH DIFFERENTIAL/PLATELET
Basophils Absolute: 0 10*3/uL (ref 0.0–0.1)
Basophils Relative: 1 %
Eosinophils Absolute: 0.2 10*3/uL (ref 0.0–0.7)
Eosinophils Relative: 5 %
HCT: 37.3 % — ABNORMAL LOW (ref 39.0–52.0)
Hemoglobin: 12.6 g/dL — ABNORMAL LOW (ref 13.0–17.0)
Lymphocytes Relative: 40 %
Lymphs Abs: 1.7 10*3/uL (ref 0.7–4.0)
MCH: 27.5 pg (ref 26.0–34.0)
MCHC: 33.8 g/dL (ref 30.0–36.0)
MCV: 81.4 fL (ref 78.0–100.0)
Monocytes Absolute: 0.4 10*3/uL (ref 0.1–1.0)
Monocytes Relative: 9 %
Neutro Abs: 1.9 10*3/uL (ref 1.7–7.7)
Neutrophils Relative %: 45 %
Platelets: 351 10*3/uL (ref 150–400)
RBC: 4.58 MIL/uL (ref 4.22–5.81)
RDW: 13.6 % (ref 11.5–15.5)
WBC: 4.3 10*3/uL (ref 4.0–10.5)

## 2015-10-24 LAB — COMPREHENSIVE METABOLIC PANEL
ALT: 39 U/L (ref 17–63)
AST: 30 U/L (ref 15–41)
Albumin: 4.3 g/dL (ref 3.5–5.0)
Alkaline Phosphatase: 52 U/L (ref 38–126)
Anion gap: 12 (ref 5–15)
BUN: 11 mg/dL (ref 6–20)
CO2: 26 mmol/L (ref 22–32)
Calcium: 9.7 mg/dL (ref 8.9–10.3)
Chloride: 101 mmol/L (ref 101–111)
Creatinine, Ser: 1.17 mg/dL (ref 0.61–1.24)
GFR calc Af Amer: >60 mL/min (ref >60)
GFR calc non Af Amer: >60 mL/min (ref >60)
Glucose, Bld: 94 mg/dL (ref 65–99)
Potassium: 3.8 mmol/L (ref 3.5–5.1)
Sodium: 139 mmol/L (ref 135–145)
Total Bilirubin: 0.5 mg/dL (ref 0.3–1.2)
Total Protein: 7.2 g/dL (ref 6.5–8.1)

## 2015-10-24 LAB — URINALYSIS, ROUTINE W REFLEX MICROSCOPIC
Bilirubin Urine: NEGATIVE
Glucose, UA: NEGATIVE mg/dL
Hgb urine dipstick: NEGATIVE
Ketones, ur: NEGATIVE mg/dL
Leukocytes, UA: NEGATIVE
Nitrite: NEGATIVE
Protein, ur: NEGATIVE mg/dL
Specific Gravity, Urine: 1.012 (ref 1.005–1.030)
pH: 5.5 (ref 5.0–8.0)

## 2015-10-24 LAB — LIPASE, BLOOD: Lipase: 37 U/L (ref 11–51)

## 2015-10-24 LAB — I-STAT CG4 LACTIC ACID, ED: Lactic Acid, Venous: 0.74 mmol/L (ref 0.5–2.0)

## 2015-10-24 MED ORDER — IBUPROFEN 800 MG PO TABS: 800.0000 mg | ORAL_TABLET | Freq: Three times a day (TID) | ORAL | Status: DC | PRN

## 2015-10-24 NOTE — ED Notes (Signed)
Pt getting dressed and awaiting paperwork at bedside.

## 2015-10-24 NOTE — Progress Notes (Signed)
VASCULAR LAB PRELIMINARY  PRELIMINARY  PRELIMINARY  PRELIMINARY  Left lower extremity venous duplex completed.    Preliminary report:  There is no DVT or SVT noted in the left lower extremity.  Leathia Farnell, RVT 10/24/2015, 9:03 AM

## 2015-10-24 NOTE — ED Notes (Signed)
Patient transported to Ultrasound 

## 2015-10-24 NOTE — ED Notes (Signed)
The pt has leg cramps for months cold  Chest congestion coughing up blood for months.  He has had scans neg results

## 2015-10-24 NOTE — Discharge Instructions (Signed)
Return here as needed. Follow up with the clinic provided. You will need to go to the clinic to schedule an appointment.

## 2015-10-28 NOTE — ED Provider Notes (Signed)
CSN: [id number]     Arrival date & time 10/24/15  0520 History   First MD Initiated Contact with Patient 10/24/15 [phone]     Chief Complaint  Patient presents with  . multiple complaints.      (Consider location/radiation/quality/duration/timing/severity/associated sxs/prior Treatment) HPI Patient presents to the emergency department with multiple complaints.  The patient states that he is having leg cramps chest congestion with cough is also noticed some blood in his cough as well.  The patient states that he is having left leg pain in the left calf.  The patient states that he has been to the emergency department several times for this.  Patient denies chest pain, weakness, dizziness, headache, blurred vision, back pain, neck pain, fever, sure, incontinence, bloody stool, hematemesis, rash, lethargy, lightheadedness, anorexia, near syncope or syncope.  Patient states that nothing seems make his condition, better or worse.  The patient did not take any medications prior to arrival History reviewed. No pertinent past medical history. Past Surgical History  Procedure Laterality Date  . Hip surgery     No family history on file. Social History  Substance Use Topics  . Smoking status: Current Every Day Smoker  . Smokeless tobacco: None  . Alcohol Use: No    Review of Systems All other systems negative except as documented in the HPI. All pertinent positives and negatives as reviewed in the HPI.   Allergies  Review of patient's allergies indicates no known allergies.  Home Medications   Prior to Admission medications   Medication Sig Start Date End Date Taking? Authorizing Provider  amoxicillin (AMOXIL) 500 MG capsule Take 1 capsule (500 mg total) by mouth 3 (three) times daily. 10/21/15  Yes Geoffery Lyons, MD  pseudoephedrine (SUDAFED) 30 MG tablet Take 30 mg by mouth every 4 (four) hours as needed for congestion.   Yes Historical Provider, MD  fluticasone (FLONASE) 50 MCG/ACT nasal  spray Place 1 spray into both nostrils 2 (two) times daily. Patient not taking: Reported on 10/04/2015 10/01/15   [facility] Ward, PA-C  ibuprofen (ADVIL,MOTRIN) 800 MG tablet Take 1 tablet (800 mg total) by mouth every 8 (eight) hours as needed. 10/24/15   Charlestine Night, PA-C  naproxen (NAPROSYN) 500 MG tablet Take 1 tablet (500 mg total) by mouth 2 (two) times daily. Patient not taking: Reported on 10/21/2015 10/01/15   [facility] Ward, PA-C   BP 125/84 mmHg  Pulse 72  Temp(Src) 98.7 F (37.1 C)  Resp 18  Ht 6\' 2"  (1.88 m)  Wt 98.034 kg  BMI 27.74 kg/m2  SpO2 100% Physical Exam  Constitutional: He is oriented to person, place, and time. He appears well-developed and well-nourished. No distress.  HENT:  Head: Normocephalic and atraumatic.  Mouth/Throat: Oropharynx is clear and moist.  Eyes: Pupils are equal, round, and reactive to light.  Neck: Normal range of motion. Neck supple.  Cardiovascular: Normal rate, regular rhythm and normal heart sounds.  Exam reveals no gallop and no friction rub.   No murmur heard. Pulmonary/Chest: Effort normal and breath sounds normal. No respiratory distress. He has no wheezes.  Abdominal: Soft. Bowel sounds are normal. He exhibits no distension. There is no tenderness.  Musculoskeletal:       Legs: Neurological: He is alert and oriented to person, place, and time. He exhibits normal muscle tone. Coordination normal.  Skin: Skin is warm and dry. No rash noted. No erythema.  Psychiatric: He has a normal mood and affect. His behavior is normal.  Nursing note and vitals reviewed.   ED Course  Procedures (including critical care time) Labs Review Labs Reviewed  CBC WITH DIFFERENTIAL/PLATELET - Abnormal; Notable for the following:    Hemoglobin 12.6 (*)    HCT 37.3 (*)    All other components within normal limits  COMPREHENSIVE METABOLIC PANEL  LIPASE, BLOOD  URINALYSIS, ROUTINE W REFLEX MICROSCOPIC (NOT AT [facility])  I-STAT CG4 LACTIC  ACID, ED    Imaging Review No results found. I have personally reviewed and evaluated these images and lab results as part of my medical decision-making.   EKG Interpretation None      MDM   Final diagnoses:  Multiple complaints     Patient is explained that he has multiple complaints or need a primary care Dr. for further evaluation of his multiple complaints.  Patient is advised to return here as needed.  Told to increase his fluid intake  Charlestine NightChristopher Michala Deblanc, PA-C 10/28/15 1511  Raeford RazorStephen Kohut, MD 10/29/15 1023

## 2015-11-20 ENCOUNTER — Emergency Department (HOSPITAL_COMMUNITY)
Admission: EM | Admit: 2015-11-20 | Discharge: 2015-11-20 | Disposition: A | Discharge status: Home / Self Care | Payer: Self-pay | Source: Home / Self Care | Attending: Emergency Medicine | Admitting: Emergency Medicine

## 2015-11-20 ENCOUNTER — Other Ambulatory Visit (HOSPITAL_COMMUNITY)
Admission: RE | Admit: 2015-11-20 | Discharge: 2015-11-20 | Disposition: A | Discharge status: Home / Self Care | Payer: Self-pay | Source: Ambulatory Visit | Attending: Emergency Medicine | Admitting: Emergency Medicine

## 2015-11-20 DIAGNOSIS — Z113 Encounter for screening for infections with a predominantly sexual mode of transmission: Secondary | ICD-10-CM

## 2015-11-20 DIAGNOSIS — R51 Headache: Secondary | ICD-10-CM

## 2015-11-20 DIAGNOSIS — R519 Headache, unspecified: Secondary | ICD-10-CM

## 2015-11-20 DIAGNOSIS — H6123 Impacted cerumen, bilateral: Secondary | ICD-10-CM

## 2015-11-20 LAB — POCT URINALYSIS DIP (DEVICE)
BILIRUBIN URINE: NEGATIVE
GLUCOSE, UA: NEGATIVE mg/dL
Ketones, ur: NEGATIVE mg/dL
LEUKOCYTES UA: NEGATIVE
NITRITE: NEGATIVE
Protein, ur: NEGATIVE mg/dL
Specific Gravity, Urine: 1.015 (ref 1.005–1.030)
UROBILINOGEN UA: 0.2 mg/dL (ref 0.0–1.0)
pH: 7 (ref 5.0–8.0)

## 2015-11-20 MED ORDER — NAPROXEN 500 MG PO TABS: 500.0000 mg | ORAL_TABLET | Freq: Two times a day (BID) | ORAL | Status: DC

## 2015-11-20 MED ORDER — IBUPROFEN 800 MG PO TABS: 800.0000 mg | ORAL_TABLET | Freq: Three times a day (TID) | ORAL | Status: DC | PRN

## 2015-11-20 MED ORDER — CYCLOBENZAPRINE HCL 10 MG PO TABS: 10.0000 mg | ORAL_TABLET | Freq: Every day | ORAL | Status: DC

## 2015-11-20 MED ORDER — FLUTICASONE PROPIONATE 50 MCG/ACT NA SUSP: 2.0000 | Freq: Every day | NASAL | Status: DC

## 2015-11-20 NOTE — Discharge Instructions (Signed)
Starts  Mucinex D, saline nasal irrigation, Naprosyn on a regular basis, soft diet for the next several days. Flexeril at night.   Give [place] a working phone number so that we can contact you if needed. Refrain from sexual contact until you know your results and your partner(s) are treated. Return if you get worse, have a fever >100.4, or for any concerns.   Go to www.goodrx.com to look up your medications. This will give you a list of where you can find your prescriptions at the most affordable prices.   This practice is taking new patients. They will see you even if you do not have insurance.  [facility] [address] [place], [place]  [address] [phone]  If you have no primary doctor, here are some resources that may be helpful:  Medicaid-accepting [facility] Providers: - [facility]- [address]  [phone];   - [facility]- [address] [place], [address] [phone]  - [facility]- [address] [phone]  [facility]- [address] [phone]  - Renaye Rakers- [address] [phone]. Only accepts [place] patients after they have her name applied to their card  -Dr. Greggory Stallion Osei-Bonsu, [facility]. [address]    [place], [place] [address]  [phone]  Self Pay (no insurance) in [place]: - Sickle Cell Patients: Dr Willey Blade, [facility] [address] [place] [phone]  - [phone]  - Physician Referral Service- [phone]  - [facility]- [address], [place], [phone];  Monday to Friday, 9 a.m. - 7 p.m.; Saturday 9 a.m. to 1 p.m.  [facility]- [address] [place], [place] [phone]  - [facility]- [address]      [phone] - [facility]- [address] [phone]  [facility], [address], [place]; [phone]; or [address], [place]; [phone].   [facility] [place], [place]. [address] [place]; [phone]; Monday to Wednesday, 8:30 a.m. - 5 p.m.; Thursday, 8:30 a.m. - 8 p.m.  [facility], [address], [place]; [phone]; Monday to Friday, 8 a.m. - 4:30 p.m.   [facility], [place] [address] [place], [phone]; first and third Saturday of the month, 9:30 a.m. - 12:30 p.m.  [facility], [address] [place], [phone]; second Saturday of the month, 9 a.m. -noon.  [facility] for children. For information, call [phone]; [phone]; or [phone].  Other agencies that provide inexpensive medical care:     [facility]  [phone]    [facility]  [phone]    [facility]  [phone] [address] [place] [address]    [facility]  [phone]    [facility]  [phone], [phone]; or [phone].  Chronic Pain Problems Contact [facility]  [phone] Patients need to be referred by their primary care doctor.  [facility]  [facility]     [employer]                          [facility] Dept. [address] [place]  [address]      [address] [place] [address]   [phone] (After Hours)  General Information: Finding a doctor when you do not have health insurance can be tricky. Although you are not limited by an insurance plan, you are of course limited by her finances and how much but he can pay out of pocket.  What are your options if you don't have health insurance?   1) Find a Librarian, academic and Pay Out of Pocket Although you won't have to find out who is covered by your insurance plan, it is a good idea to ask around and get recommendations. You will then need to call the  office and see if the doctor you have chosen will accept you as a new patient and what types of options they offer for patients who are self-pay. Some doctors offer discounts or will set up payment plans for their patients who do not have insurance, but you will need to ask so you aren't surprised when you get to your appointment.  2) Contact Your Local Health Department Not all health departments have doctors that can see patients for sick visits, but many do, so it is worth a call to see if yours does. If you don't know where your local health department is, you can check in your phone book. The CDC also has a tool to help you locate your state's health department, and many state websites also have listings of all of their local health departments.  3) Find a Walk-in Clinic If your illness is not likely to be very severe or complicated, you may want to try a walk in clinic. These are popping up all over the country in pharmacies, drugstores, and shopping centers. They're usually staffed by nurse practitioners or physician assistants that have been trained to treat common illnesses and complaints. They're usually fairly quick and inexpensive. However, if you have serious medical issues or chronic medical problems, these are probably not your best option

## 2015-11-20 NOTE — ED Provider Notes (Signed)
HPI  SUBJECTIVE:  Kevin Boone is a 25 y.o. male who presents with multiple complaints. First, he reports intermittent, hours long frontal headache for the past 3-4 days. He reports nasal worsening nasal congestion, sinus pain/pressure, left ear pain, decreased hearing, clicking in his left ear. He reports postnasal drip, some sore throat. Some epistaxis that resolved with direct pressure today. There are no aggravating or alleviating factors. He has tried saline nasal spray and ibuprofen. Last dose of ibuprofen more than 6 hours ago. No nausea, vomiting, fevers, visual changes, allergy type symptoms, neck stiffness, rash, sick contacts. Headache is not associated with eating, chewing, lying down, bending forward. He is also requesting STD testing. He reports occasional dysuria for the past month. He denies nausea, vomiting, fevers, abdominal pain, back pain, urinary urgency, frequency, cloudy or odorous urine, hematuria, penile rash, discharge, testicular pain or swelling. He has had multiple partners, estimates 5 sexual partners in the past 6 months. States that he is consistently using condoms with his current sexual partner, who is a male. She is asymptomatic. He states that he is currently in a monogamous relationship. Past medical history negative for UTI, diabetes, gonorrhea, chlamydia, HIV, HIV, syphilis, Trichomonas. No history of allergies, sinusitis, TMJ disorder, hypertension. He does grind his teeth at night.  No past medical history on file.  Past Surgical History  Procedure Laterality Date  . Hip surgery      No family history on file.  Social History  Substance Use Topics  . Smoking status: Current Every Day Smoker  . Smokeless tobacco: Not on file  . Alcohol Use: No    No current facility-administered medications for this encounter.  Current outpatient prescriptions:  .  cyclobenzaprine (FLEXERIL) 10 MG tablet, Take 1 tablet (10 mg total) by mouth at bedtime., Disp: 20  tablet, Rfl: 0 .  fluticasone (FLONASE) 50 MCG/ACT nasal spray, Place 2 sprays into both nostrils daily., Disp: 16 g, Rfl: 0 .  naproxen (NAPROSYN) 500 MG tablet, Take 1 tablet (500 mg total) by mouth 2 (two) times daily., Disp: 20 tablet, Rfl: 0  No Known Allergies   ROS  As noted in HPI.   Physical Exam  BP 135/82 mmHg  Pulse 69  Temp(Src) 98.7 F (37.1 C) (Oral)  Resp 14  SpO2 100%  Constitutional: Well developed, well nourished, no acute distress Eyes:  EOMI, conjunctiva normal bilaterally HENT: Normocephalic, atraumatic,mucus membranes moist bilateral cerumen impaction. Cerumen removed with curette bilaterally, TMs normal bilaterally. Positive left TMJ tenderness, crepitus. No drooling, trismus, normal dentition. Positive erythematous, swollen turbinates with small abrasion left nare. Mild bilateral frontal sinus tenderness. Maxillary sinus tenderness Normal oropharynx. Lymph: No cervical lymphadenopathy, full range of motion neck Respiratory: Normal inspiratory effort Cardiovascular: Normal rate GI: nondistended soft, nontender, no suprapubic tenderness GU: Patient declined exam. skin: No rash, skin intact Musculoskeletal: no deformities Neurologic: Alert & oriented x 3, no focal neuro deficits Psychiatric: Speech and behavior appropriate   ED Course   Medications - No data to display  Orders Placed This Encounter  Procedures  . HIV antibody    Standing Status: Standing     Number of Occurrences: 1     Standing Expiration Date:   . RPR    Standing Status: Standing     Number of Occurrences: 1     Standing Expiration Date:   . POCT urinalysis dip (device)    Standing Status: Standing     Number of Occurrences: 1     Standing Expiration  Date:     Results for orders placed or performed during the hospital encounter of 11/20/15 (from the past 24 hour(s))  POCT urinalysis dip (device)     Status: Abnormal   Collection Time: 11/20/15  8:40 PM  Result Value  Ref Range   Glucose, UA NEGATIVE NEGATIVE mg/dL   Bilirubin Urine NEGATIVE NEGATIVE   Ketones, ur NEGATIVE NEGATIVE mg/dL   Specific Gravity, Urine 1.015 1.005 - 1.030   Hgb urine dipstick TRACE (A) NEGATIVE   pH 7.0 5.0 - 8.0   Protein, ur NEGATIVE NEGATIVE mg/dL   Urobilinogen, UA 0.2 0.0 - 1.0 mg/dL   Nitrite NEGATIVE NEGATIVE   Leukocytes, UA NEGATIVE NEGATIVE   No results found.  ED Clinical Impression  Acute nonintractable headache, unspecified headache type  Screening for STD (sexually transmitted disease)  Cerumen impaction, bilateral   ED Assessment/Plan   1. Cerumen impaction bilateral, disimpacted with a curet by me. 2, headache: Appears to be either sinus headache or due to TMJ pain. We'll send home with ibuprofen, soft diet, Flexeril. He is to start some saline nasal irrigation some Mucinex D for his nasal congestion. 3: STD check. Sent off gonorrhea, chlamydia, HIV, RPR. Deferring treatment until labs resulted. Urine unremarkable for trace hematuria, but no UTI. Advised patient give [place] a working phone number so we contact him if necessary. Will provide primary care referral list. Discussed medical decision-making, signs and symptoms that should prompt return to the ER to the urgent care. Patient agrees with plan.  *This clinic note was created using Dragon dictation software. Therefore, there may be occasional mistakes despite careful proofreading.  ?   Domenick Gong, MD 11/20/15 2138

## 2015-11-20 NOTE — ED Notes (Signed)
Patient complains of having a lot of sinus pressure and some blood Out of the left nostril

## 2015-11-21 LAB — RPR: RPR Ser Ql: NONREACTIVE

## 2015-11-21 LAB — HIV ANTIBODY (ROUTINE TESTING W REFLEX): HIV Screen 4th Generation wRfx: NONREACTIVE

## 2015-11-22 LAB — URINE CYTOLOGY ANCILLARY ONLY
CHLAMYDIA, DNA PROBE: NEGATIVE
NEISSERIA GONORRHEA: NEGATIVE
TRICH (WINDOWPATH): NEGATIVE

## 2015-11-23 ENCOUNTER — Telehealth (HOSPITAL_COMMUNITY): Payer: Self-pay | Admitting: Emergency Medicine

## 2015-11-23 NOTE — ED Notes (Signed)
Pt called wanting recent lab results from visit 1/28 Pt ID'd properly  Per Dr. Dayton Scrape,  Please notify patient that tests for gonorrhea/chlamydia/trichomonas were negative.  Please let patient know that syphilis test and HIV test were negative  Pt given education on safe sex and proper hgyiene Adv pt if sx are not getting better to return  Pt verb understanding.

## 2016-09-03 IMAGING — CR DG CHEST 2V
2 series · 2 of 2 positions shown · non-contrast
Comparison: None.

CLINICAL DATA: Right side chest pain and shortness of breath today.
Initial encounter.

EXAM:
CHEST  2 VIEW

[w chest pa]
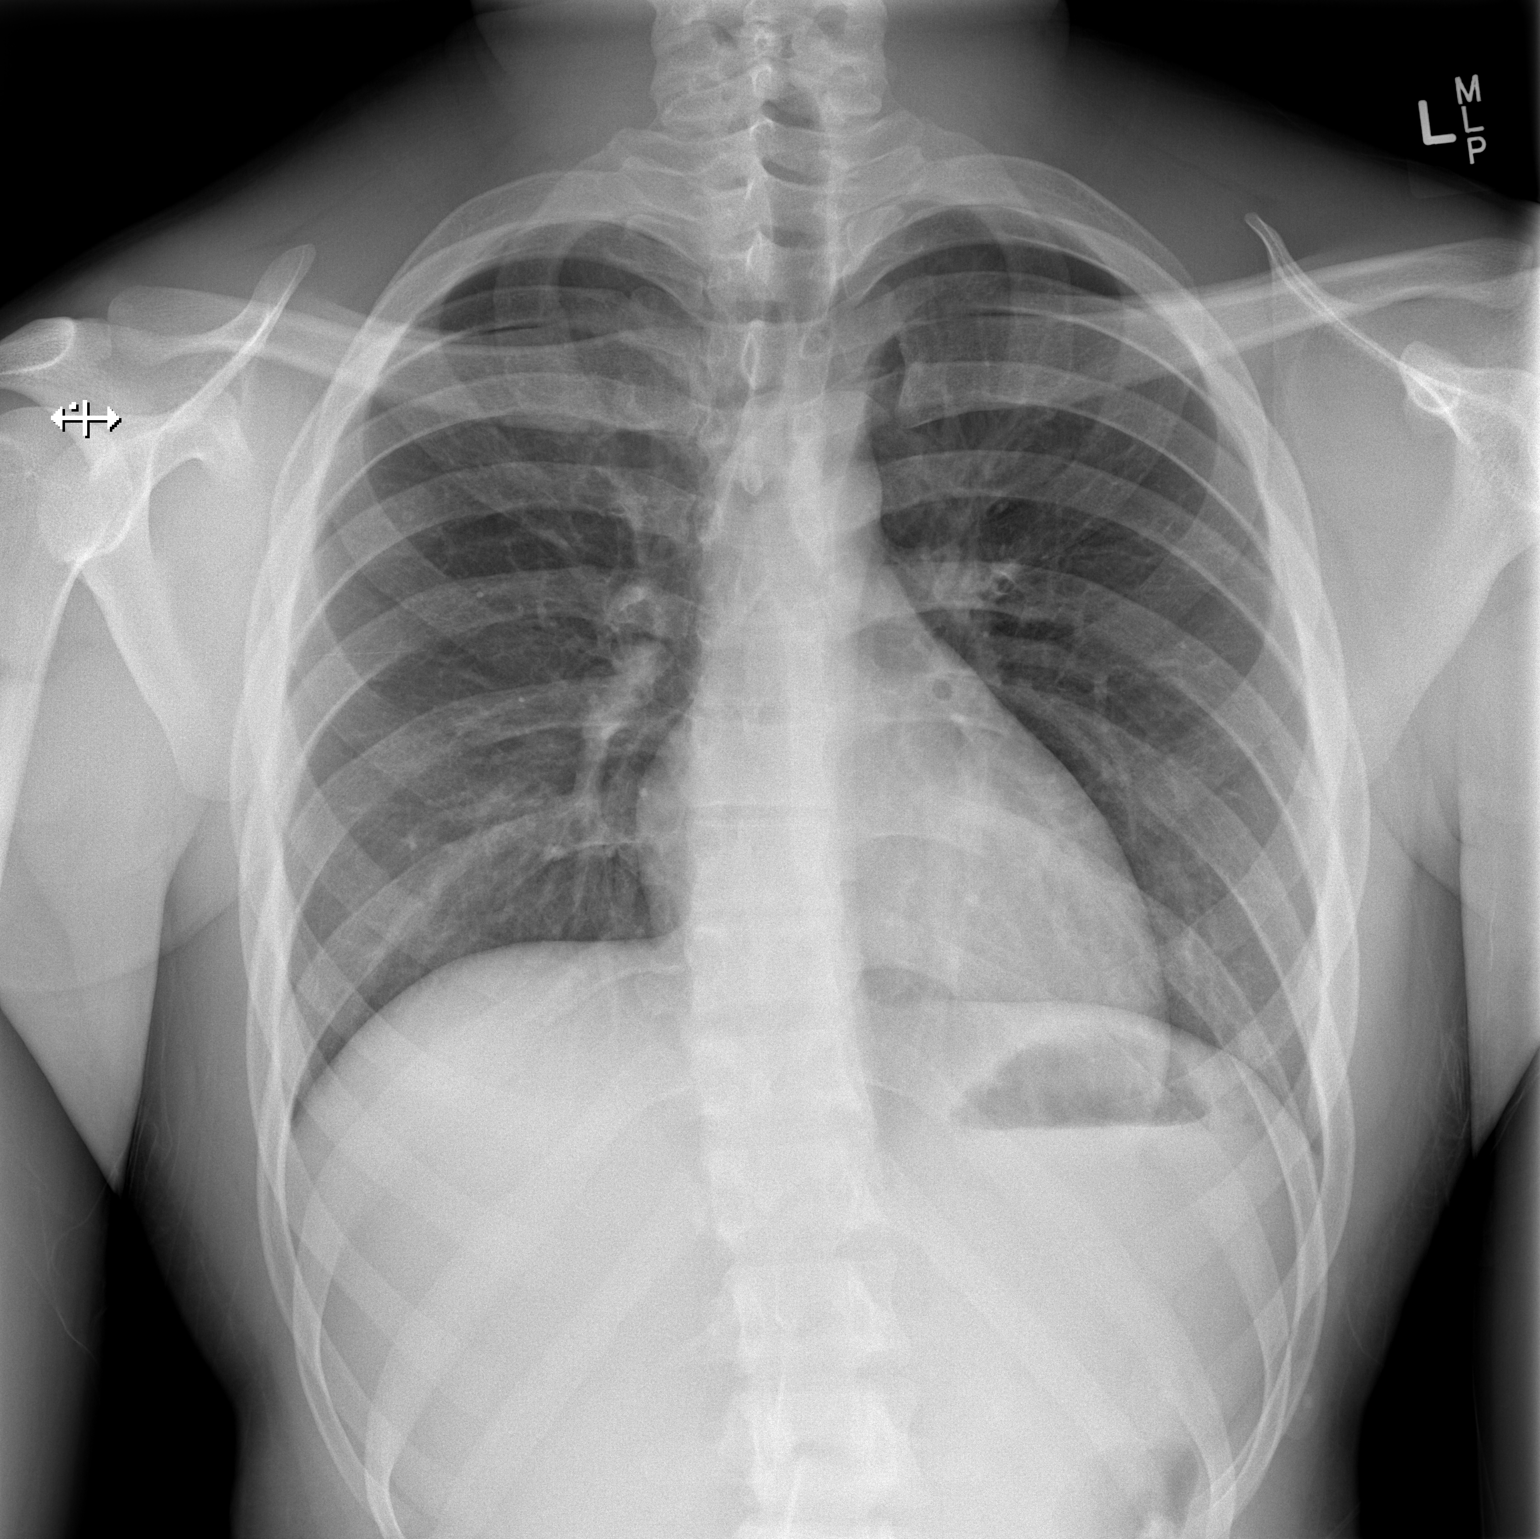

[w chest lat]
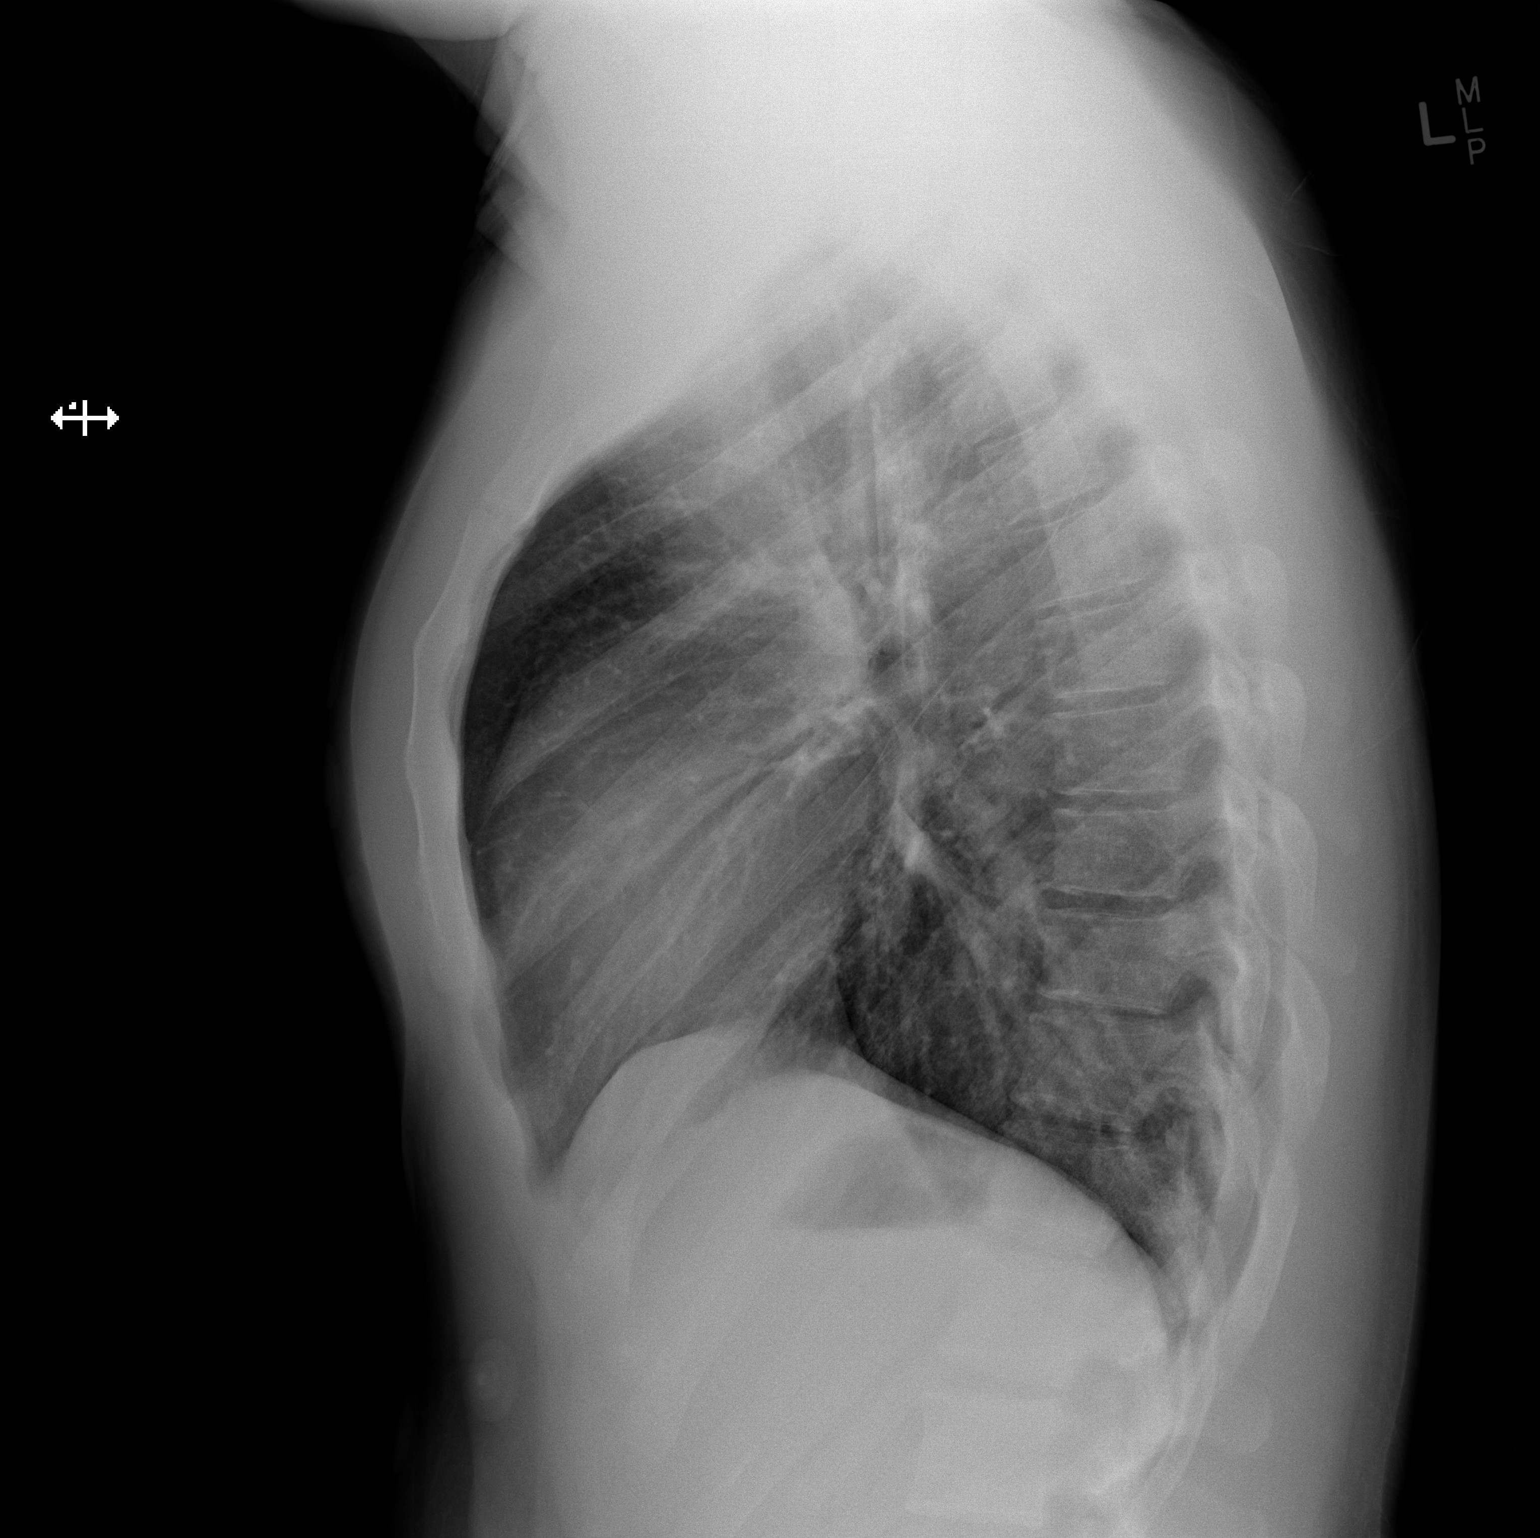

[2 of 2 positions shown; findings below may reference images not displayed]

FINDINGS: The lungs are clear. Heart size is normal. There is no pneumothorax
or pleural effusion. No focal bony abnormality.
IMPRESSION: No acute disease.

## 2016-12-21 ENCOUNTER — Emergency Department (HOSPITAL_COMMUNITY): Payer: BLUE CROSS/BLUE SHIELD

## 2016-12-21 ENCOUNTER — Encounter (HOSPITAL_COMMUNITY): Payer: Self-pay

## 2016-12-21 ENCOUNTER — Emergency Department (HOSPITAL_COMMUNITY)
Admission: EM | Admit: 2016-12-21 | Discharge: 2016-12-21 | Disposition: A | Discharge status: Home / Self Care | Payer: BLUE CROSS/BLUE SHIELD | Attending: Emergency Medicine | Admitting: Emergency Medicine

## 2016-12-21 ENCOUNTER — Encounter (HOSPITAL_COMMUNITY): Payer: Self-pay | Admitting: Emergency Medicine

## 2016-12-21 ENCOUNTER — Other Ambulatory Visit: Payer: Self-pay

## 2016-12-21 ENCOUNTER — Ambulatory Visit (HOSPITAL_COMMUNITY)
Admission: EM | Admit: 2016-12-21 | Discharge: 2016-12-21 | Disposition: A | Discharge status: Nursing Home (Includes ICF) | Payer: BLUE CROSS/BLUE SHIELD | Attending: Internal Medicine | Admitting: Internal Medicine

## 2016-12-21 DIAGNOSIS — Z87891 Personal history of nicotine dependence: Secondary | ICD-10-CM | POA: Insufficient documentation

## 2016-12-21 DIAGNOSIS — R0789 Other chest pain: Secondary | ICD-10-CM

## 2016-12-21 DIAGNOSIS — R9431 Abnormal electrocardiogram [ECG] [EKG]: Secondary | ICD-10-CM | POA: Diagnosis not present

## 2016-12-21 DIAGNOSIS — R079 Chest pain, unspecified: Secondary | ICD-10-CM | POA: Diagnosis present

## 2016-12-21 HISTORY — DX: Bronchitis, not specified as acute or chronic: J40

## 2016-12-21 LAB — CBC
HCT: 39.6 % (ref 39.0–52.0)
Hemoglobin: 12.9 g/dL — ABNORMAL LOW (ref 13.0–17.0)
MCH: 26.4 pg (ref 26.0–34.0)
MCHC: 32.6 g/dL (ref 30.0–36.0)
MCV: 81 fL (ref 78.0–100.0)
Platelets: 314 10*3/uL (ref 150–400)
RBC: 4.89 MIL/uL (ref 4.22–5.81)
RDW: 13.9 % (ref 11.5–15.5)
WBC: 4.7 10*3/uL (ref 4.0–10.5)

## 2016-12-21 LAB — BASIC METABOLIC PANEL
ANION GAP: 11 (ref 5–15)
BUN: 9 mg/dL (ref 6–20)
CALCIUM: 10 mg/dL (ref 8.9–10.3)
CO2: 25 mmol/L (ref 22–32)
Chloride: 101 mmol/L (ref 101–111)
Creatinine, Ser: 1.04 mg/dL (ref 0.61–1.24)
GFR calc Af Amer: >60 mL/min (ref >60)
Glucose, Bld: 97 mg/dL (ref 65–99)
Potassium: 3.9 mmol/L (ref 3.5–5.1)
SODIUM: 137 mmol/L (ref 135–145)

## 2016-12-21 LAB — I-STAT TROPONIN, ED
TROPONIN I, POC: 0 ng/mL (ref 0.00–0.08)
Troponin i, poc: 0 ng/mL (ref 0.00–0.08)

## 2016-12-21 MED ORDER — ASPIRIN 325 MG PO TABS
325.0000 mg | ORAL_TABLET | Freq: Every day | ORAL | Status: DC
  Administered 2016-12-21: 325 mg via ORAL

## 2016-12-21 MED ORDER — ASPIRIN 81 MG PO CHEW
CHEWABLE_TABLET | ORAL | Status: AC
  Filled 2016-12-21: qty 4

## 2016-12-21 NOTE — ED Notes (Signed)
Notified [facility] 

## 2016-12-21 NOTE — Discharge Instructions (Signed)
I'm recommending transfer to the emergency department for further evaluation and management. You have and abnormal EKG reading. Meaning, you have what appears to be 2 mm of elevation in V2, you have 3 mm of elevation in the 3, and 1 mm of elevation in V4. These changes are not consistent with her prior EKG that was obtained on 10/01/2015. This needs further evaluation than what I can provide for use here in the urgent care

## 2016-12-21 NOTE — ED Notes (Signed)
No carelink truck 

## 2016-12-21 NOTE — ED Notes (Signed)
EKG completed and given to Dorena BodoLawrence Kennard, NP

## 2016-12-21 NOTE — ED Triage Notes (Signed)
Pt from UC c/o chest tightness and EKG changes since 2016. Pt having 3/10 chest tightness with no radiation. Given 324 ASA en route and 1 Nitro. Pain now 1/10. Pt also given 150ml NaCl. VSS, NAD at this time

## 2016-12-21 NOTE — Discharge Instructions (Signed)
Follow up with a heart doctor regarding your chest pain and visit to the ED today. Your work up today did not suggest any concerning cause of your chest pain. You may try taking tylenol or ibuprofen for symptoms, if needed. We advise you to return if your symptoms worsen.

## 2016-12-21 NOTE — ED Provider Notes (Signed)
CSN: [id number]     Arrival date & time 12/21/16  1613 History   First MD Initiated Contact with Patient 12/21/16 1638     Chief Complaint  Patient presents with  . Hypertension   (Consider location/radiation/quality/duration/timing/severity/associated sxs/prior Treatment) 26 year old male presents to clinic with a chief complaint of elevated blood pressure. He states his girlfriend was taking his blood pressure at home today and had a systolic blood pressure of 190. 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 He is not currently taking any medications, he does not have a primary care provider, has not had a physical in some time. He is also complaining of a midsternal chest "tightness" that is been ongoing for several days. He denies worsening with exertion, he denies shortness of breath, he does state he has had some intermittent dizziness. Both parents are alive and well with histories of hypertension, and diabetes, however he denies either of them have had heart attack, or stroke. He denies any palpitations, he denies any swelling in his hands feet or ankles.   The history is provided by the patient.  Hypertension     History reviewed. No pertinent past medical history. Past Surgical History:  Procedure Laterality Date  . HIP SURGERY     No family history on file. Social History  Substance Use Topics  . Smoking status: Former Games developermoker  . Smokeless tobacco: Never Used  . Alcohol use No    Review of Systems  Reason unable to perform ROS: As covered in history of present illness.  All other systems reviewed and are negative.   Allergies  Patient has no known allergies.  Home Medications   Prior to Admission medications   Medication Sig Start Date End Date Taking? Authorizing Provider  Lactobacillus (PROBIOTIC ACIDOPHILUS PO) Take by mouth.   Yes Historical Provider, MD  Multiple Vitamin  (MULTIVITAMIN) capsule Take 1 capsule by mouth daily.   Yes Historical Provider, MD  cyclobenzaprine (FLEXERIL) 10 MG tablet Take 1 tablet (10 mg total) by mouth at bedtime. 11/20/15   Domenick GongAshley Mortenson, MD  fluticasone (FLONASE) 50 MCG/ACT nasal spray Place 2 sprays into both nostrils daily. 11/20/15   Domenick GongAshley Mortenson, MD  naproxen (NAPROSYN) 500 MG tablet Take 1 tablet (500 mg total) by mouth 2 (two) times daily. 11/20/15   Domenick GongAshley Mortenson, MD   Meds Ordered and Administered this Visit   Medications  aspirin tablet 325 mg (325 mg Oral Given 12/21/16 1736)    BP 157/89 (BP Location: Left Arm) Comment (BP Location): large cuff on forearm  Pulse 82   Temp 99.1 F (37.3 C) (Oral)   Resp 22   SpO2 98%  No data found.   Physical Exam  Constitutional: He is oriented to person, place, and time. He appears well-developed and well-nourished. No distress.  HENT:  Head: Normocephalic and atraumatic.  Right Ear: External ear normal.  Left Ear: External ear normal.  Eyes: EOM are normal. Pupils are equal, round, and reactive to light.  Neck: Normal range of motion. Neck supple. No JVD present.  Cardiovascular: Normal rate, regular rhythm, normal heart sounds and intact distal pulses.  Exam reveals no gallop and no friction rub.   No murmur heard. Pulmonary/Chest: Effort normal and breath sounds normal. No respiratory distress. He has no wheezes. He has no rales.  Abdominal: Soft. Bowel sounds are normal. He exhibits no distension. There is no tenderness. There  is no rebound and no guarding.  Lymphadenopathy:    He has no cervical adenopathy.  Neurological: He is alert and oriented to person, place, and time.  Skin: Skin is warm and dry. Capillary refill takes less than 2 seconds. He is not diaphoretic.  Psychiatric: He has a normal mood and affect.  Nursing note and vitals reviewed.   Urgent Care Course     ED EKG Date/Time: 12/21/2016 6:02 PM Performed by: Dorena Bodo Authorized  by: Dorena Bodo   ECG reviewed by ED Physician in the absence of a cardiologist: no   Previous ECG:    Previous ECG:  Compared to current   Comparison ECG info:  10/01/2015   Similarity:  Changes noted Interpretation:    Interpretation: abnormal   Rate:    ECG rate:  67 Rhythm:    Rhythm: sinus rhythm   Ectopy:    Ectopy: none   QRS:    QRS axis:  Normal   QRS intervals:  Normal Conduction:    Conduction: normal   ST segments:    ST segments:  Abnormal   Depression:  V2, V3 and V4 T waves:    T waves: non-specific   Q waves:    Q waves:  V1 Comments:     Ventricular rate 67 Pr: 178 ms QRS: 102 ms QT/QTc: 364/384 ms 1 mm elevation in V2, 3 mm of elevation in V3, 1 mm of elevation in V4.   (including critical care time)  Labs Review Labs Reviewed - No data to display  Imaging Review No results found.      MDM   1. Abnormal EKG    I'm recommending transfer to the emergency department for further evaluation and management. You have and abnormal EKG reading. Meaning, you have what appears to be 2 mm of elevation in V2, you have 3 mm of elevation in the 3, and 1 mm of elevation in V4. These changes are not consistent with her prior EKG that was obtained on 10/01/2015. This needs further evaluation than what I can provide for use here in the urgent care      Dorena Bodo, NP 12/21/16 1809

## 2016-12-21 NOTE — ED Provider Notes (Signed)
11:26 PM Patient care assumed from Terance HartKelly Gekas, PA-C at change of shift. Plan discussed with Jefm BryantGekas, PA-C which includes discharge with instruction for outpatient follow-up if delta troponin reassuring. Patient has continued to be monitored since change of shift at 2000. He has been hemodynamically stable. Repeat troponin is negative. Given age and reassuring heart score of 2, low suspicion for acute or concerning coronary event. Patient to be discharged with referral to cardiology. Return precautions discussed and provided. Patient discharged in stable condition with no unaddressed concerns.  Results for orders placed or performed during the hospital encounter of 12/21/16  Basic metabolic panel  Result Value Ref Range   Sodium 137 135 - 145 mmol/L   Potassium 3.9 3.5 - 5.1 mmol/L   Chloride 101 101 - 111 mmol/L   CO2 25 22 - 32 mmol/L   Glucose, Bld 97 65 - 99 mg/dL   BUN 9 6 - 20 mg/dL   Creatinine, Ser 1.611.04 0.61 - 1.24 mg/dL   Calcium 09.610.0 8.9 - 04.510.3 mg/dL   GFR calc non Af Amer >60 >60 mL/min   GFR calc Af Amer >60 >60 mL/min   Anion gap 11 5 - 15  CBC  Result Value Ref Range   WBC 4.7 4.0 - 10.5 K/uL   RBC 4.89 4.22 - 5.81 MIL/uL   Hemoglobin 12.9 (L) 13.0 - 17.0 g/dL   HCT 40.939.6 81.139.0 - 91.452.0 %   MCV 81.0 78.0 - 100.0 fL   MCH 26.4 26.0 - 34.0 pg   MCHC 32.6 30.0 - 36.0 g/dL   RDW 78.213.9 95.611.5 - 21.315.5 %   Platelets 314 150 - 400 K/uL  I-stat troponin, ED  Result Value Ref Range   Troponin i, poc 0.00 0.00 - 0.08 ng/mL   Comment 3          I-stat troponin, ED  Result Value Ref Range   Troponin i, poc 0.00 0.00 - 0.08 ng/mL   Comment 3           Dg Chest 2 View  Result Date: 12/21/2016 CLINICAL DATA:  Chest pain EXAM: CHEST  2 VIEW COMPARISON:  10/20/2015 FINDINGS: Heart and mediastinal contours are within normal limits. No focal opacities or effusions. No acute bony abnormality. IMPRESSION: No active cardiopulmonary disease. Electronically Signed   By: Charlett NoseKevin  Dover M.D.   On:  12/21/2016 18:58      Antony MaduraKelly Nakiyah Beverley, PA-C 12/21/16 2328    Marily MemosJason Mesner, MD 12/21/16 [phone]

## 2016-12-21 NOTE — ED Triage Notes (Addendum)
Has been told by school [facility] that bp runs high.  Checked bp on home blood pressure monitor and registered 190/?  Patient feeling tired.  Feeling "rundown" patient has been stressing over exams this week.  Currently patient is trying to make it to another exam.  Patient is anxious.  Running low grade temp.

## 2016-12-21 NOTE — ED Provider Notes (Signed)
[facility] Provider Note   CSN: [id number] Arrival date & time: 12/21/16  1824    History   Chief Complaint Chief Complaint  Patient presents with  . Chest Pain    HPI Kevin Boone is a 26 y.o. male who presents with chest tightness. No significant past medical history. He states that he's felt intermittently lightheaded yesterday and today. He feels like he was going to pass out but denies any syncopal episodes. Today he noticed some chest tightness/soreness this morning. It is in the center of his chest, non-exertional, and nonradiating. He rates it currently as a 1-2/10. He states his girlfriend checks his blood pressure and it was in the systolics of 190 however states that he does not think this is accurate because the cuff was too tight. Nothing has made it better or worse. He has not tried any medicines over-the-counter. No significant family history of cardiac disease. He states he is a former smoker. He notes an episode of diarrhea today but denies abdominal pain, nausea, vomiting, urinary symptoms. Denies fever, chills, URI symptoms, shortness of breath. Denies cocaine use. Denies any recent weight lifting or trauma to the chest. It was intermittent this morning however became constant and therefore he went to urgent care for further evaluation. They performed an EKG which was interpreted as having elevations in leads V2, V3, V4. EMS was called and he received 324mg  ASA and Nitro.   HPI  Past Medical History:  Diagnosis Date  . Bronchitis 2016    There are no active problems to display for this patient.   Past Surgical History:  Procedure Laterality Date  . HIP SURGERY       Home Medications    Prior to Admission medications   Medication Sig Start Date End Date Taking? Authorizing Provider  cyclobenzaprine (FLEXERIL) 10 MG tablet Take 1 tablet (10 mg total) by mouth at bedtime. 11/20/15   Domenick GongAshley Mortenson, MD  fluticasone (FLONASE) 50 MCG/ACT nasal spray Place 2  sprays into both nostrils daily. 11/20/15   Domenick GongAshley Mortenson, MD  Lactobacillus (PROBIOTIC ACIDOPHILUS PO) Take by mouth.    Historical Provider, MD  Multiple Vitamin (MULTIVITAMIN) capsule Take 1 capsule by mouth daily.    Historical Provider, MD  naproxen (NAPROSYN) 500 MG tablet Take 1 tablet (500 mg total) by mouth 2 (two) times daily. 11/20/15   Domenick GongAshley Mortenson, MD    Family History No family history on file.  Social History Social History  Substance Use Topics  . Smoking status: Former Games developermoker  . Smokeless tobacco: Never Used  . Alcohol use Yes     Comment: "moderately"     Allergies   Patient has no known allergies.   Review of Systems Review of Systems  Constitutional: Negative for chills and fever.  Respiratory: Negative for cough, shortness of breath and wheezing.   Cardiovascular: Positive for chest pain. Negative for palpitations and leg swelling.  Gastrointestinal: Positive for diarrhea. Negative for abdominal pain, nausea and vomiting.  Genitourinary: Negative for dysuria and frequency.  All other systems reviewed and are negative.    Physical Exam Updated Vital Signs BP 147/78 (BP Location: Left Arm)   Pulse 74   Temp 98.6 F (37 C) (Oral)   Resp 19   Ht 6\' 2"  (1.88 m)   Wt (!) 137.9 kg   SpO2 93%   BMI 39.03 kg/m   Physical Exam  Constitutional: He is oriented to person, place, and time. He appears well-developed and well-nourished. No distress.  NAD  HENT:  Head: Normocephalic and atraumatic.  Eyes: Conjunctivae are normal. Pupils are equal, round, and reactive to light. Right eye exhibits no discharge. Left eye exhibits no discharge. No scleral icterus.  Neck: Normal range of motion.  Cardiovascular: Normal rate and regular rhythm.  Exam reveals no gallop and no friction rub.   No murmur heard. Pulmonary/Chest: Effort normal and breath sounds normal. No respiratory distress. He has no wheezes. He has no rales. He exhibits no tenderness.    Abdominal: Soft. Bowel sounds are normal. He exhibits no distension and no mass. There is no tenderness. There is no rebound and no guarding. No hernia.  Neurological: He is alert and oriented to person, place, and time.  Skin: Skin is warm and dry.  Psychiatric: He has a normal mood and affect. His behavior is normal.  Nursing note and vitals reviewed.    ED Treatments / Results  Labs (all labs ordered are listed, but only abnormal results are displayed) Labs Reviewed  CBC - Abnormal; Notable for the following:       Result Value   Hemoglobin 12.9 (*)    All other components within normal limits  BASIC METABOLIC PANEL  I-STAT TROPOININ, ED  I-STAT TROPOININ, ED  Rosezena Sensor, ED    EKG  EKG Interpretation  Date/Time:  Thursday December 21 2016 18:23:52 EST Ventricular Rate:  81 PR Interval:    QRS Duration: 105 QT Interval:  363 QTC Calculation: 422 R Axis:   33 Text Interpretation:  Sinus rhythm Borderline ST elevation, anterior leads elevation in I/aVL similar to earlier in day new TWI in III Confirmed by [facility] MD, JASON [phone]) on 12/21/2016 6:30:26 PM       Radiology Dg Chest 2 View  Result Date: 12/21/2016 CLINICAL DATA:  Chest pain EXAM: CHEST  2 VIEW COMPARISON:  10/20/2015 FINDINGS: Heart and mediastinal contours are within normal limits. No focal opacities or effusions. No acute bony abnormality. IMPRESSION: No active cardiopulmonary disease. Electronically Signed   By: Charlett Nose M.D.   On: 12/21/2016 18:58    Procedures Procedures (including critical care time)  Medications Ordered in ED Medications - No data to display   Initial Impression / Assessment and Plan / ED Course  I have reviewed the triage vital signs and the nursing notes.  Pertinent labs & imaging results that were available during my care of the patient were reviewed by me and considered in my medical decision making (see chart for details).  26 year old male presents with atypical  chest pain. Chest pain work up is reassuring. Doubt ACS, PE, pericarditis, esophageal rupture, tension pneumothorax, aortic dissection, cardiac tamponade. EKG is SR with non-specific changes. CXR is negative. Initial troponin is 0. Labs are unremarkable. No significant past or family hx of cardiac disease. Patient is non-smoker. HEART score is 2. PERC negative. 2nd troponin is pending. Will sign out patient to Carmie Kanner PA-C who will dispo accordingly.  Final Clinical Impressions(s) / ED Diagnoses   Final diagnoses:  Atypical chest pain    New Prescriptions New Prescriptions   No medications on file     Bethel Born, PA-C 12/22/16 1704    Marily Memos, MD 12/29/16 1410

## 2017-01-18 ENCOUNTER — Ambulatory Visit: Payer: BLUE CROSS/BLUE SHIELD | Admitting: Family Medicine

## 2017-06-08 ENCOUNTER — Emergency Department (HOSPITAL_COMMUNITY)
Admission: EM | Admit: 2017-06-08 | Discharge: 2017-06-09 | Disposition: A | Discharge status: Home / Self Care | Payer: BLUE CROSS/BLUE SHIELD | Attending: Emergency Medicine | Admitting: Emergency Medicine

## 2017-06-08 ENCOUNTER — Encounter (HOSPITAL_COMMUNITY): Payer: Self-pay | Admitting: Emergency Medicine

## 2017-06-08 DIAGNOSIS — Y939 Activity, unspecified: Secondary | ICD-10-CM | POA: Diagnosis not present

## 2017-06-08 DIAGNOSIS — R1030 Lower abdominal pain, unspecified: Secondary | ICD-10-CM | POA: Insufficient documentation

## 2017-06-08 DIAGNOSIS — M549 Dorsalgia, unspecified: Secondary | ICD-10-CM | POA: Insufficient documentation

## 2017-06-08 DIAGNOSIS — Z87891 Personal history of nicotine dependence: Secondary | ICD-10-CM | POA: Diagnosis not present

## 2017-06-08 DIAGNOSIS — R0789 Other chest pain: Secondary | ICD-10-CM | POA: Insufficient documentation

## 2017-06-08 DIAGNOSIS — Z041 Encounter for examination and observation following transport accident: Secondary | ICD-10-CM | POA: Diagnosis present

## 2017-06-08 DIAGNOSIS — Y999 Unspecified external cause status: Secondary | ICD-10-CM | POA: Diagnosis not present

## 2017-06-08 DIAGNOSIS — M25519 Pain in unspecified shoulder: Secondary | ICD-10-CM | POA: Insufficient documentation

## 2017-06-08 DIAGNOSIS — Y929 Unspecified place or not applicable: Secondary | ICD-10-CM | POA: Diagnosis not present

## 2017-06-08 DIAGNOSIS — M62838 Other muscle spasm: Secondary | ICD-10-CM | POA: Diagnosis not present

## 2017-06-08 DIAGNOSIS — M7918 Myalgia, other site: Secondary | ICD-10-CM
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
## 2017-06-08 NOTE — ED Notes (Signed)
Bed: WA03 Expected date:  Expected time:  Means of arrival:  Comments: 

## 2017-06-09 ENCOUNTER — Emergency Department (HOSPITAL_COMMUNITY): Payer: BLUE CROSS/BLUE SHIELD

## 2017-06-09 MED ORDER — METHOCARBAMOL 500 MG PO TABS: 500.0000 mg | ORAL_TABLET | Freq: Two times a day (BID) | ORAL | 0 refills | Status: DC

## 2017-06-09 MED ORDER — DIAZEPAM 5 MG PO TABS
5.0000 mg | ORAL_TABLET | Freq: Once | ORAL | Status: DC
  Filled 2017-06-09: qty 1

## 2017-06-09 MED ORDER — NAPROXEN 500 MG PO TABS
500.0000 mg | ORAL_TABLET | Freq: Once | ORAL | Status: DC
  Filled 2017-06-09: qty 1

## 2017-06-09 MED ORDER — HYDROCODONE-ACETAMINOPHEN 5-325 MG PO TABS
1.0000 | ORAL_TABLET | Freq: Once | ORAL | Status: DC
  Filled 2017-06-09: qty 1

## 2017-06-09 MED ORDER — IBUPROFEN 600 MG PO TABS: 600.0000 mg | ORAL_TABLET | Freq: Four times a day (QID) | ORAL | 0 refills | Status: DC | PRN
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
## 2017-12-20 ENCOUNTER — Other Ambulatory Visit: Payer: Self-pay

## 2017-12-20 ENCOUNTER — Ambulatory Visit (HOSPITAL_COMMUNITY)
Admission: EM | Admit: 2017-12-20 | Discharge: 2017-12-20 | Disposition: A | Discharge status: Home / Self Care | Payer: BLUE CROSS/BLUE SHIELD | Attending: Physician Assistant | Admitting: Physician Assistant

## 2017-12-20 ENCOUNTER — Encounter (HOSPITAL_COMMUNITY): Payer: Self-pay | Admitting: Emergency Medicine

## 2017-12-20 DIAGNOSIS — J0140 Acute pansinusitis, unspecified: Secondary | ICD-10-CM | POA: Diagnosis not present

## 2017-12-20 MED ORDER — IPRATROPIUM BROMIDE 0.06 % NA SOLN: 2.0000 | Freq: Four times a day (QID) | NASAL | 0 refills | Status: DC

## 2017-12-20 MED ORDER — CETIRIZINE-PSEUDOEPHEDRINE ER 5-120 MG PO TB12: 1.0000 | ORAL_TABLET | Freq: Every day | ORAL | 0 refills | Status: DC

## 2017-12-20 MED ORDER — AMOXICILLIN-POT CLAVULANATE 875-125 MG PO TABS: 1.0000 | ORAL_TABLET | Freq: Two times a day (BID) | ORAL | 0 refills | Status: DC

## 2017-12-20 NOTE — ED Provider Notes (Signed)
[facility]    CSN: [id number] Arrival date & time: 12/20/17  1927     History   Chief Complaint Chief Complaint  Patient presents with  . Facial Pain    HPI Kevin Boone is a 27 y.o. male.   27 year old male comes in for 2-week history of URI symptoms. Sinus pressure, nasal congestion, rhinorrhea, cough, sore throat. Sweats with subjective fever. Had some light headedness that lasted about 5 mins and resolved on own.  Denies weakness, syncope.  Flonase with temporary relief.  Ibuprofen and Mucinex with some relief.  Never smoker.      Past Medical History:  Diagnosis Date  . Bronchitis 2016    There are no active problems to display for this patient.   Past Surgical History:  Procedure Laterality Date  . HIP SURGERY         Home Medications    Prior to Admission medications   Medication Sig Start Date End Date Taking? Authorizing Provider  amoxicillin-clavulanate (AUGMENTIN) 875-125 MG tablet Take 1 tablet by mouth every 12 (twelve) hours. 12/20/17   Cathie HoopsYu, Daly Whipkey V, PA-C  cetirizine-pseudoephedrine (ZYRTEC-D) 5-120 MG tablet Take 1 tablet by mouth daily. 12/20/17   Cathie HoopsYu, Keegen Heffern V, PA-C  cyclobenzaprine (FLEXERIL) 10 MG tablet Take 1 tablet (10 mg total) by mouth at bedtime. Patient not taking: Reported on 12/21/2016 11/20/15   Domenick GongMortenson, Ashley, MD  fluticasone [facility](FLONASE) 50 MCG/ACT nasal spray Place 2 sprays into both nostrils daily. Patient not taking: Reported on 12/21/2016 11/20/15   Domenick GongMortenson, Ashley, MD  ibuprofen (ADVIL,MOTRIN) 600 MG tablet Take 1 tablet (600 mg total) by mouth every 6 (six) hours as needed. 06/09/17   Derwood KaplanNanavati, Ankit, MD  ipratropium (ATROVENT) 0.06 % nasal spray Place 2 sprays into both nostrils 4 (four) times daily. 12/20/17   Cathie HoopsYu, Vada Swift V, PA-C  methocarbamol (ROBAXIN) 500 MG tablet Take 1 tablet (500 mg total) by mouth 2 (two) times daily. Patient not taking: Reported on 12/20/2017 06/09/17   Derwood KaplanNanavati, Ankit, MD  naproxen (NAPROSYN) 500 MG tablet  Take 1 tablet (500 mg total) by mouth 2 (two) times daily. Patient not taking: Reported on 12/21/2016 11/20/15   Domenick GongMortenson, Ashley, MD    Family History No family history on file.  Social History Social History   Tobacco Use  . Smoking status: Former Games developermoker  . Smokeless tobacco: Never Used  Substance Use Topics  . Alcohol use: Yes    Comment: "moderately"  . Drug use: No     Allergies   Patient has no known allergies.   Review of Systems Review of Systems  Reason unable to perform ROS: See HPI as above.     Physical Exam Triage Vital Signs ED Triage Vitals [12/20/17 2009]  Enc Vitals Group     BP (!) 154/82     Pulse Rate 63     Resp 18     Temp 98.2 F (36.8 C)     Temp src      SpO2 100 %     Weight      Height      Head Circumference      Peak Flow      Pain Score 4     Pain Loc      Pain Edu?      Excl. in GC?    No data found.  Updated Vital Signs BP (!) 154/82   Pulse 63   Temp 98.2 F (36.8 C)   Resp 18  SpO2 100%   Physical Exam  Constitutional: He is oriented to person, place, and time. He appears well-developed and well-nourished. No distress.  HENT:  Head: Normocephalic and atraumatic.  Right Ear: External ear and ear canal normal. Tympanic membrane is erythematous. Tympanic membrane is not bulging.  Left Ear: External ear and ear canal normal. Tympanic membrane is erythematous. Tympanic membrane is not bulging.  Nose: Mucosal edema and rhinorrhea present. Right sinus exhibits no maxillary sinus tenderness and no frontal sinus tenderness. Left sinus exhibits no maxillary sinus tenderness and no frontal sinus tenderness.  Mouth/Throat: Uvula is midline, oropharynx is clear and moist and mucous membranes are normal.  Eyes: Conjunctivae are normal. Pupils are equal, round, and reactive to light.  Neck: Normal range of motion. Neck supple.  Cardiovascular: Normal rate, regular rhythm and normal heart sounds. Exam reveals no gallop and no  friction rub.  No murmur heard. Pulmonary/Chest: Effort normal and breath sounds normal. He has no decreased breath sounds. He has no wheezes. He has no rhonchi. He has no rales.  Lymphadenopathy:    He has no cervical adenopathy.  Neurological: He is alert and oriented to person, place, and time.  Skin: Skin is warm and dry.  Psychiatric: He has a normal mood and affect. His behavior is normal. Judgment normal.     UC Treatments / Results  Labs (all labs ordered are listed, but only abnormal results are displayed) Labs Reviewed - No data to display  EKG  EKG Interpretation None       Radiology No results found.  Procedures Procedures (including critical care time)  Medications Ordered in UC Medications - No data to display   Initial Impression / Assessment and Plan / UC Course  I have reviewed the triage vital signs and the nursing notes.  Pertinent labs & imaging results that were available during my care of the patient were reviewed by me and considered in my medical decision making (see chart for details).    Augmentin for sinusitis.  Other symptomatic treatment discussed.  Push fluids.  Return precautions given.  Patient expresses understanding and agrees to plan.  Final Clinical Impressions(s) / UC Diagnoses   Final diagnoses:  Acute non-recurrent pansinusitis    ED Discharge Orders        Ordered    ipratropium (ATROVENT) 0.06 % nasal spray  4 times daily     12/20/17 2047    amoxicillin-clavulanate (AUGMENTIN) 875-125 MG tablet  Every 12 hours     12/20/17 2047    cetirizine-pseudoephedrine (ZYRTEC-D) 5-120 MG tablet  Daily     12/20/17 2048        Belinda Fisher, PA-C 12/20/17 2051

## 2017-12-20 NOTE — Discharge Instructions (Signed)
Augmentin for sinus infection. Start flonase, atrovent nasal spray, zyrtec-D for nasal congestion/drainage. You can use over the counter nasal saline rinse such as neti pot for nasal congestion. Keep hydrated, your urine should be clear to pale yellow in color. Tylenol/motrin for fever and pain. Monitor for any worsening of symptoms, chest pain, shortness of breath, wheezing, swelling of the throat, follow up for reevaluation.

## 2017-12-20 NOTE — ED Triage Notes (Signed)
Pt c/o sinus pain x2 weeks, with some dizziness.

## 2019-01-02 ENCOUNTER — Ambulatory Visit (HOSPITAL_COMMUNITY)
Admission: EM | Admit: 2019-01-02 | Discharge: 2019-01-02 | Disposition: A | Discharge status: Home / Self Care | Payer: BLUE CROSS/BLUE SHIELD | Attending: Family Medicine | Admitting: Family Medicine

## 2019-01-02 ENCOUNTER — Encounter (HOSPITAL_COMMUNITY): Payer: Self-pay | Admitting: Emergency Medicine

## 2019-01-02 DIAGNOSIS — J320 Chronic maxillary sinusitis: Secondary | ICD-10-CM

## 2019-01-02 MED ORDER — AMOXICILLIN-POT CLAVULANATE 875-125 MG PO TABS: 1.0000 | ORAL_TABLET | Freq: Two times a day (BID) | ORAL | 0 refills | Status: DC

## 2019-01-02 NOTE — ED Triage Notes (Signed)
Patient presents to [facility] for assessment of fever/chills, body aches, cough, nasal congestion, fatigue and intermittent diarrhea (also drinking kambucha tea) x 2 weeks.  States dark green mucous, some CP with exertion.  C/o neck and abdominal discomfort at this time.

## 2019-01-02 NOTE — ED Provider Notes (Signed)
[facility]    CSN: [id number] Arrival date & time: 01/02/19  1315     History   Chief Complaint Chief Complaint  Patient presents with  . Flu-Like Symptoms    HPI Kevin Boone is a 28 y.o. male.   Patient has a history of fever chills body aches cough congestion fatigue.  Now is called coughing up some dark green mucus.  Also endorses sinus congestion headache and pressure.  There is a history of deviated septum from prior nasal fracture  HPI  Past Medical History:  Diagnosis Date  . Bronchitis 2016    There are no active problems to display for this patient.   Past Surgical History:  Procedure Laterality Date  . HIP SURGERY         Home Medications    Prior to Admission medications   Medication Sig Start Date End Date Taking? Authorizing Provider  amoxicillin-clavulanate (AUGMENTIN) 875-125 MG tablet Take 1 tablet by mouth every 12 (twelve) hours. 12/20/17   Cathie Hoops, Amy V, PA-C  cetirizine-pseudoephedrine (ZYRTEC-D) 5-120 MG tablet Take 1 tablet by mouth daily. 12/20/17   Cathie Hoops, Amy V, PA-C  cyclobenzaprine (FLEXERIL) 10 MG tablet Take 1 tablet (10 mg total) by mouth at bedtime. Patient not taking: Reported on 12/21/2016 11/20/15   Domenick Gong, MD  fluticasone [facility]) 50 MCG/ACT nasal spray Place 2 sprays into both nostrils daily. Patient not taking: Reported on 12/21/2016 11/20/15   Domenick Gong, MD  ibuprofen (ADVIL,MOTRIN) 600 MG tablet Take 1 tablet (600 mg total) by mouth every 6 (six) hours as needed. 06/09/17   Derwood Kaplan, MD  ipratropium (ATROVENT) 0.06 % nasal spray Place 2 sprays into both nostrils 4 (four) times daily. 12/20/17   Cathie Hoops, Amy V, PA-C  methocarbamol (ROBAXIN) 500 MG tablet Take 1 tablet (500 mg total) by mouth 2 (two) times daily. Patient not taking: Reported on 12/20/2017 06/09/17   Derwood Kaplan, MD  naproxen (NAPROSYN) 500 MG tablet Take 1 tablet (500 mg total) by mouth 2 (two) times daily. Patient not taking: Reported on  12/21/2016 11/20/15   Domenick Gong, MD    Family History History reviewed. No pertinent family history.  Social History Social History   Tobacco Use  . Smoking status: Former Games developer  . Smokeless tobacco: Never Used  Substance Use Topics  . Alcohol use: Yes    Comment: "moderately"  . Drug use: No     Allergies   Patient has no known allergies.   Review of Systems Review of Systems  Constitutional: Positive for fatigue and fever.  HENT: Positive for congestion.   Respiratory: Positive for cough.   All other systems reviewed and are negative.    Physical Exam Triage Vital Signs ED Triage Vitals  Enc Vitals Group     BP 01/02/19 1342 138/81     Pulse Rate 01/02/19 1342 76     Resp 01/02/19 1342 16     Temp 01/02/19 1342 98.1 F (36.7 C)     Temp Source 01/02/19 1342 Oral     SpO2 01/02/19 1342 100 %     Weight --      Height --      Head Circumference --      Peak Flow --      Pain Score 01/02/19 1347 2     Pain Loc --      Pain Edu? --      Excl. in GC? --    No data found.  Updated  Vital Signs BP 138/81 (BP Location: Left Arm)   Pulse 76   Temp 98.1 F (36.7 C) (Oral)   Resp 16   SpO2 100%   Visual Acuity Right Eye Distance:   Left Eye Distance:   Bilateral Distance:    Right Eye Near:   Left Eye Near:    Bilateral Near:     Physical Exam Constitutional:      Appearance: Normal appearance. He is normal weight.  HENT:     Ears:     Comments: Both EACs are full of cerumen, cannot visualize TM    Nose: Congestion present.     Mouth/Throat:     Mouth: Mucous membranes are dry.     Pharynx: Oropharynx is clear.  Cardiovascular:     Rate and Rhythm: Normal rate and regular rhythm.  Pulmonary:     Effort: Pulmonary effort is normal.     Breath sounds: Normal breath sounds.  Neurological:     General: No focal deficit present.     Mental Status: He is alert and oriented to person, place, and time.      UC Treatments / Results   Labs (all labs ordered are listed, but only abnormal results are displayed) Labs Reviewed - No data to display  EKG None  Radiology No results found.  Procedures Procedures (including critical care time)  Medications Ordered in UC Medications - No data to display  Initial Impression / Assessment and Plan / UC Course  I have reviewed the triage vital signs and the nursing notes.  Pertinent labs & imaging results that were available during my care of the patient were reviewed by me and considered in my medical decision making (see chart for details).     Sinusitis.  Will treat with amoxicillin also recommended increase fluid intake and humidification Final Clinical Impressions(s) / UC Diagnoses   Final diagnoses:  None   Discharge Instructions   None    ED Prescriptions    None     Controlled Substance Prescriptions [place] Controlled Substance Registry consulted? No   Frederica Kuster, MD 01/02/19 [phone]

## 2019-02-11 ENCOUNTER — Emergency Department (HOSPITAL_COMMUNITY)
Admission: EM | Admit: 2019-02-11 | Discharge: 2019-02-12 | Disposition: A | Discharge status: Other Acute Inpatient Hospital | Payer: Self-pay | Attending: Emergency Medicine | Admitting: Emergency Medicine

## 2019-02-11 DIAGNOSIS — R462 Strange and inexplicable behavior: Secondary | ICD-10-CM | POA: Insufficient documentation

## 2019-02-11 DIAGNOSIS — Z79899 Other long term (current) drug therapy: Secondary | ICD-10-CM | POA: Insufficient documentation

## 2019-02-11 DIAGNOSIS — Z87891 Personal history of nicotine dependence: Secondary | ICD-10-CM | POA: Insufficient documentation

## 2019-02-11 DIAGNOSIS — R4689 Other symptoms and signs involving appearance and behavior: Secondary | ICD-10-CM

## 2019-02-11 LAB — URINALYSIS, ROUTINE W REFLEX MICROSCOPIC
Bilirubin Urine: NEGATIVE
Glucose, UA: NEGATIVE mg/dL
Hgb urine dipstick: NEGATIVE
Ketones, ur: 20 mg/dL — AB
Leukocytes,Ua: NEGATIVE
Nitrite: NEGATIVE
Protein, ur: 30 mg/dL — AB
Specific Gravity, Urine: 1.029 (ref 1.005–1.030)
pH: 5 (ref 5.0–8.0)

## 2019-02-11 LAB — CBC WITH DIFFERENTIAL/PLATELET
Abs Immature Granulocytes: 0.02 10*3/uL (ref 0.00–0.07)
Basophils Absolute: 0 10*3/uL (ref 0.0–0.1)
Basophils Relative: 0 %
Eosinophils Absolute: 0 10*3/uL (ref 0.0–0.5)
Eosinophils Relative: 0 %
HCT: 41.6 % (ref 39.0–52.0)
Hemoglobin: 13.3 g/dL (ref 13.0–17.0)
Immature Granulocytes: 0 %
Lymphocytes Relative: 23 %
Lymphs Abs: 1.6 10*3/uL (ref 0.7–4.0)
MCH: 26.8 pg (ref 26.0–34.0)
MCHC: 32 g/dL (ref 30.0–36.0)
MCV: 83.7 fL (ref 80.0–100.0)
Monocytes Absolute: 0.9 10*3/uL (ref 0.1–1.0)
Monocytes Relative: 12 %
Neutro Abs: 4.6 10*3/uL (ref 1.7–7.7)
Neutrophils Relative %: 65 %
Platelets: 303 10*3/uL (ref 150–400)
RBC: 4.97 MIL/uL (ref 4.22–5.81)
RDW: 13.6 % (ref 11.5–15.5)
WBC: 7.1 10*3/uL (ref 4.0–10.5)
nRBC: 0 % (ref 0.0–0.2)

## 2019-02-11 LAB — RAPID URINE DRUG SCREEN, HOSP PERFORMED
Amphetamines: NOT DETECTED
Barbiturates: NOT DETECTED
Benzodiazepines: NOT DETECTED
Cocaine: NOT DETECTED
Opiates: NOT DETECTED
Tetrahydrocannabinol: POSITIVE — AB

## 2019-02-11 LAB — COMPREHENSIVE METABOLIC PANEL
ALT: 48 U/L — ABNORMAL HIGH (ref 0–44)
AST: 37 U/L (ref 15–41)
Albumin: 5 g/dL (ref 3.5–5.0)
Alkaline Phosphatase: 53 U/L (ref 38–126)
Anion gap: 11 (ref 5–15)
BUN: 12 mg/dL (ref 6–20)
CO2: 27 mmol/L (ref 22–32)
Calcium: 9.8 mg/dL (ref 8.9–10.3)
Chloride: 102 mmol/L (ref 98–111)
Creatinine, Ser: 1.03 mg/dL (ref 0.61–1.24)
GFR calc Af Amer: >60 mL/min (ref >60)
GFR calc non Af Amer: >60 mL/min (ref >60)
Glucose, Bld: 104 mg/dL — ABNORMAL HIGH (ref 70–99)
Potassium: 3.6 mmol/L (ref 3.5–5.1)
Sodium: 140 mmol/L (ref 135–145)
Total Bilirubin: 0.7 mg/dL (ref 0.3–1.2)
Total Protein: 8.4 g/dL — ABNORMAL HIGH (ref 6.5–8.1)

## 2019-02-11 LAB — ETHANOL: Alcohol, Ethyl (B): <10 mg/dL (ref <10)

## 2019-02-11 LAB — ACETAMINOPHEN LEVEL: Acetaminophen (Tylenol), Serum: <10 ug/mL — ABNORMAL LOW (ref 10–30)

## 2019-02-11 LAB — SALICYLATE LEVEL: Salicylate Lvl: <7 mg/dL (ref 2.8–30.0)

## 2019-02-11 NOTE — Progress Notes (Signed)
Pt meets inpatient criteria per Kayren Eaves, NP. Referral information has been sent to the following hospitals for review:  [facility]  [facility]  [facility]  [facility]  [facility]  [facility]  [facility]  [facility]  [facility]  [facility]  [facility]   Disposition will continue to assist with pt's inpatient placement needs.   Wells Guiles, LCSW, LCAS Disposition CSW [facility]/TTS [phone]

## 2019-02-11 NOTE — ED Notes (Signed)
Pt presents with Bizarre behavior, walking naked down town.  Pt awake, alert & responsive, no distress noted, withdrawn, not forthcoming with information. Guarded, cooperative.  1-1 sitter at bedside.  Monitoring for safety.

## 2019-02-11 NOTE — ED Notes (Signed)
Pt does not want v/s checked.
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
## 2019-02-11 NOTE — ED Notes (Signed)
PA, Debarah Crape, made aware that pt will not keep clothing on.
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
## 2019-02-11 NOTE — ED Notes (Signed)
[name], NP, patient meets inpatient criteria. Selena Batten, AC, no appropriate beds. TTS to secure placement. Cassandra, RN, informed of disposition.

## 2019-02-12 NOTE — ED Notes (Signed)
Called report to [facility] at [facility]. Called [facility] for Transport.

## 2019-02-12 NOTE — ED Notes (Signed)
Transported to [facility] by [facility]. All belongings returned to pt. Pt was cooperative and understood his destination.

## 2019-02-12 NOTE — ED Provider Notes (Signed)
Pt has been accepted by dr Wendall Stade at [facility] and is agreeable to going   Lorre Nick, MD 02/12/19 [phone]

## 2019-02-12 NOTE — BH Assessment (Signed)
[facility] Assessment Progress Note  Per [name], NP, this pt requires psychiatric hospitalization at this time.  At 08:34 Christiane Ha calls from [facility].  Pt has been accepted to their facility by Dr Wendall Stade to the [facility] unit.  EDP Lorre Nick, MD, concurs with this disposition, as does the pt who is currently under voluntary status.  Pt's nurse, Diane, has been notified, and agrees to call report to [phone].  Pt is to be transported via Leota Sauers, [place] Behavioral Health Coordinator [phone]

## 2019-08-05 ENCOUNTER — Other Ambulatory Visit: Payer: Self-pay

## 2019-08-05 ENCOUNTER — Ambulatory Visit: Payer: Self-pay | Admitting: Otolaryngology

## 2019-08-05 ENCOUNTER — Encounter (HOSPITAL_BASED_OUTPATIENT_CLINIC_OR_DEPARTMENT_OTHER): Payer: Self-pay | Admitting: *Deleted

## 2019-08-05 DIAGNOSIS — J3489 Other specified disorders of nose and nasal sinuses: Secondary | ICD-10-CM

## 2019-08-05 NOTE — H&P (Signed)
PREOPERATIVE H&P  Chief Complaint: Nasal obstruction  HPI: Kevin Boone is a 28 y.o. 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  He has been having difficulty breathing out of his nose for over 10 years.  He has tried nasal steroid sprays as well as saline rinses with minimal benefit.  On intranasal exam he has a moderate septal deviation to the left with large turbinates bilaterally and swollen nasal mucosa.  He is taken to the operating room at this time for septoplasty and bilateral inferior turbinate reductions to help improve his nasal airway and breathing.  Past Medical History:  Diagnosis Date  . Bronchitis 2016   Past Surgical History:  Procedure Laterality Date  . HIP SURGERY     Social History   Socioeconomic History  . Marital status: Single    Spouse name: Not on file  . Number of children: Not on file  . Years of education: Not on file  . Highest education level: Not on file  Occupational History  . Not on file  Social Needs  . Financial resource strain: Not on file  . Food insecurity    Worry: Not on file    Inability: Not on file  . Transportation needs    Medical: Not on file    Non-medical: Not on file  Tobacco Use  . Smoking status: Former Research scientist (life sciences)  . Smokeless tobacco: Never Used  Substance and Sexual Activity  . Alcohol use: Yes    Comment: "moderately"  . Drug use: Yes    Types: Marijuana    Comment: rare  . Sexual activity: Not on file  Lifestyle  . Physical activity    Days per week: Not on file    Minutes per session: Not on file  . Stress: Not on file  Relationships  . Social Herbalist on phone: Not on file    Gets together: Not on file    Attends religious service: Not on file    Active member of club or organization: Not on file    Attends meetings of clubs or organizations: Not on file    Relationship status: Not on file  Other Topics Concern  . Not on file  Social History Narrative  . Not on file    History reviewed. No pertinent family history. No Known Allergies Prior to Admission medications   Not on File     Positive ROS: Negative  All other systems have been reviewed and were otherwise negative with the exception of those mentioned in the HPI and as above.  Physical Exam: There were no vitals filed for this visit.  General: Alert, no acute distress Oral: Normal oral mucosa and tonsils Nasal: Septal deviation to the left with turbinate hypertrophy.  No polyps noted.  Clear middle meatus bilaterally Neck: No palpable adenopathy or thyroid nodules Ear: Ear canal is clear with normal appearing TMs Cardiovascular: Regular rate and rhythm, no murmur.  Respiratory: Clear to auscultation Neurologic: Alert and oriented x 3   Assessment/Plan: DEVIATED SEPTUM, BILATERAL TURBINATE HYPERTROPHY Plan for Procedure(s): NASAL SEPTOPLASTY WITH BILATERAL TURBINATE REDUCTION   Melony Overly, MD 08/05/2019 5:26 PM

## 2019-08-08 ENCOUNTER — Other Ambulatory Visit (HOSPITAL_COMMUNITY): Admission: RE | Admit: 2019-08-08 | Payer: BC Managed Care – PPO | Source: Ambulatory Visit

## 2019-08-11 NOTE — Progress Notes (Signed)
Patient did not show for scheduled covid test on Saturday, August 09, 2019. Called today to reschedule. No answer. Left message on voicemail.

## 2019-08-12 ENCOUNTER — Other Ambulatory Visit: Payer: Self-pay

## 2019-08-12 ENCOUNTER — Ambulatory Visit (HOSPITAL_BASED_OUTPATIENT_CLINIC_OR_DEPARTMENT_OTHER): Admission: RE | Admit: 2019-08-12 | Payer: BC Managed Care – PPO | Source: Home / Self Care | Admitting: Otolaryngology

## 2019-08-12 ENCOUNTER — Encounter (HOSPITAL_COMMUNITY): Payer: Self-pay

## 2019-08-12 ENCOUNTER — Emergency Department (HOSPITAL_COMMUNITY)
Admission: EM | Admit: 2019-08-12 | Discharge: 2019-08-12 | Disposition: A | Discharge status: Home / Self Care | Payer: BC Managed Care – PPO | Attending: Emergency Medicine | Admitting: Emergency Medicine

## 2019-08-12 DIAGNOSIS — Z202 Contact with and (suspected) exposure to infections with a predominantly sexual mode of transmission: Secondary | ICD-10-CM | POA: Insufficient documentation

## 2019-08-12 DIAGNOSIS — F419 Anxiety disorder, unspecified: Secondary | ICD-10-CM | POA: Insufficient documentation

## 2019-08-12 DIAGNOSIS — Z87891 Personal history of nicotine dependence: Secondary | ICD-10-CM | POA: Diagnosis not present

## 2019-08-12 LAB — URINALYSIS, ROUTINE W REFLEX MICROSCOPIC
Bilirubin Urine: NEGATIVE
Glucose, UA: NEGATIVE mg/dL
Hgb urine dipstick: NEGATIVE
Ketones, ur: 5 mg/dL — AB
Leukocytes,Ua: NEGATIVE
Nitrite: NEGATIVE
Protein, ur: NEGATIVE mg/dL
Specific Gravity, Urine: 1.006 (ref 1.005–1.030)
pH: 5 (ref 5.0–8.0)

## 2019-08-12 SURGERY — SEPTOPLASTY, NOSE, WITH NASAL TURBINATE REDUCTION
Anesthesia: General

## 2019-08-12 NOTE — ED Triage Notes (Signed)
Pt arrived stating he has been feeling anxious recently due to stress with bills, school, and a relationship. Reports smoking marijuana about an hour ago. Denies any SI or HI. No history of anxiety.

## 2019-08-12 NOTE — ED Notes (Addendum)
Patient also requesting to be checked for STDs, denies any symptoms or new partners

## 2019-08-12 NOTE — Discharge Instructions (Addendum)
We will call you if your cultures indicate you require further treatment or action.  Return to the emergency department if symptoms significantly worsen or change.

## 2019-08-12 NOTE — ED Provider Notes (Signed)
[facility]-EMERGENCY DEPT Provider Note   CSN: [id number] Arrival date & time: 08/12/19  0421     History   Chief Complaint Chief Complaint  Patient presents with  . Anxiety    HPI Kevin Boone is a 28 y.o. male.     Patient is a 28 year old male with history of anxiety.  He presents today with complaints of feeling anxious.  He tells me he smoked marijuana this evening with his friend, then began to feel anxious afterward.  He denies any chest pain or difficulty breathing.  Patient states that he would also like to be tested for STDs.  He denies to me he has had any sexual contact in the past 3 months since breaking up with his girlfriend.  He denies any dysuria.  He denies any fevers or chills.  The history is provided by the patient.  Anxiety This is a new problem. The current episode started 1 to 2 hours ago. The problem occurs constantly. The problem has not changed since onset.Pertinent negatives include no chest pain and no shortness of breath. Nothing aggravates the symptoms. Nothing relieves the symptoms. He has tried nothing for the symptoms.    Past Medical History:  Diagnosis Date  . Bronchitis 2016    There are no active problems to display for this patient.   Past Surgical History:  Procedure Laterality Date  . HIP SURGERY          Home Medications    Prior to Admission medications   Not on File    Family History No family history on file.  Social History Social History   Tobacco Use  . Smoking status: Former Games developer  . Smokeless tobacco: Never Used  Substance Use Topics  . Alcohol use: Yes    Comment: "moderately"  . Drug use: Yes    Types: Marijuana    Comment: rare     Allergies   Patient has no known allergies.   Review of Systems Review of Systems  Respiratory: Negative for shortness of breath.   Cardiovascular: Negative for chest pain.  All other systems reviewed and are negative.    Physical Exam  Updated Vital Signs BP (!) 145/80 (BP Location: Left Arm)   Pulse 88   Temp 98.9 F (37.2 C) (Oral)   Resp 17   Ht 6\' 2"  (1.88 m)   Wt 99.8 kg   SpO2 98%   BMI 28.25 kg/m   Physical Exam Vitals signs and nursing note reviewed.  Constitutional:      General: He is not in acute distress.    Appearance: He is well-developed. He is not diaphoretic.  HENT:     Head: Normocephalic and atraumatic.  Neck:     Musculoskeletal: Normal range of motion and neck supple.  Cardiovascular:     Rate and Rhythm: Normal rate and regular rhythm.     Heart sounds: No murmur. No friction rub.  Pulmonary:     Effort: Pulmonary effort is normal. No respiratory distress.     Breath sounds: Normal breath sounds. No wheezing or rales.  Abdominal:     General: Bowel sounds are normal. There is no distension.     Palpations: Abdomen is soft.     Tenderness: There is no abdominal tenderness.  Musculoskeletal: Normal range of motion.  Skin:    General: Skin is warm and dry.  Neurological:     Mental Status: He is alert and oriented to person, place, and time.  Coordination: Coordination normal.      ED Treatments / Results  Labs (all labs ordered are listed, but only abnormal results are displayed) Labs Reviewed  GC/CHLAMYDIA PROBE AMP ([place]) NOT AT [facility]    EKG None  Radiology No results found.  Procedures Procedures (including critical care time)  Medications Ordered in ED Medications - No data to display   Initial Impression / Assessment and Plan / ED Course  I have reviewed the triage vital signs and the nursing notes.  Pertinent labs & imaging results that were available during my care of the patient were reviewed by me and considered in my medical decision making (see chart for details).  Patient presenting with complaints of feeling anxious after smoking marijuana.  Patient appears well.  He is not hyperventilating or showing signs of a panic attack.  He is very  calm and collected.  At this point I do not feel as any further work-up is indicated into this.  Patient advised against smoking marijuana in the future.  He also requests an STD check.  He has not been sexually active in several months since splitting up with his girlfriend, but wants "checked out".  Urinalysis is clear and GC/Chlamydia cultures are pending.  Patient to be discharged with notification if test become positive.  Final Clinical Impressions(s) / ED Diagnoses   Final diagnoses:  None    ED Discharge Orders    None       Veryl Speak, MD 08/12/19 [phone]
